# Patient Record
Sex: Female | Born: 1960 | Race: Black or African American | Hispanic: No | Marital: Married | Smoking: Never smoker
Health system: Southern US, Community
[De-identification: ages and names within clinical notes are randomized; demographics above are authoritative.]

## PROBLEM LIST (undated history)

## (undated) DIAGNOSIS — IMO0002 Reserved for concepts with insufficient information to code with codable children: Secondary | ICD-10-CM

## (undated) DIAGNOSIS — T8859XA Other complications of anesthesia, initial encounter: Secondary | ICD-10-CM

## (undated) DIAGNOSIS — K219 Gastro-esophageal reflux disease without esophagitis: Secondary | ICD-10-CM

## (undated) DIAGNOSIS — R011 Cardiac murmur, unspecified: Secondary | ICD-10-CM

## (undated) DIAGNOSIS — D573 Sickle-cell trait: Secondary | ICD-10-CM

## (undated) DIAGNOSIS — T4145XA Adverse effect of unspecified anesthetic, initial encounter: Secondary | ICD-10-CM

## (undated) DIAGNOSIS — T7840XA Allergy, unspecified, initial encounter: Secondary | ICD-10-CM

## (undated) HISTORY — DX: Cardiac murmur, unspecified: R01.1

## (undated) HISTORY — DX: Gastro-esophageal reflux disease without esophagitis: K21.9

## (undated) HISTORY — PX: HEMORRHOID SURGERY: SHX153

## (undated) HISTORY — PX: DILATION AND CURETTAGE OF UTERUS: SHX78

## (undated) HISTORY — DX: Allergy, unspecified, initial encounter: T78.40XA

## (undated) HISTORY — PX: ABDOMINAL HYSTERECTOMY: SHX81

## (undated) HISTORY — DX: Sickle-cell trait: D57.3

## (undated) HISTORY — DX: Reserved for concepts with insufficient information to code with codable children: IMO0002

---

## 1998-04-16 ENCOUNTER — Other Ambulatory Visit: Admission: RE | Admit: 1998-04-16 | Discharge: 1998-04-16 | Payer: Self-pay | Admitting: Obstetrics and Gynecology

## 1999-03-30 ENCOUNTER — Encounter: Payer: Self-pay | Admitting: Emergency Medicine

## 1999-03-30 ENCOUNTER — Emergency Department (HOSPITAL_COMMUNITY): Admission: EM | Admit: 1999-03-30 | Discharge: 1999-03-30 | Payer: Self-pay | Admitting: Emergency Medicine

## 1999-05-23 ENCOUNTER — Other Ambulatory Visit: Admission: RE | Admit: 1999-05-23 | Discharge: 1999-05-23 | Payer: Self-pay | Admitting: Obstetrics and Gynecology

## 2000-07-06 ENCOUNTER — Other Ambulatory Visit: Admission: RE | Admit: 2000-07-06 | Discharge: 2000-07-06 | Payer: Self-pay | Admitting: Obstetrics and Gynecology

## 2000-08-18 ENCOUNTER — Ambulatory Visit (HOSPITAL_COMMUNITY): Admission: RE | Admit: 2000-08-18 | Discharge: 2000-08-18 | Payer: Self-pay | Admitting: Obstetrics and Gynecology

## 2001-08-09 ENCOUNTER — Other Ambulatory Visit: Admission: RE | Admit: 2001-08-09 | Discharge: 2001-08-09 | Payer: Self-pay | Admitting: Obstetrics and Gynecology

## 2003-01-03 ENCOUNTER — Other Ambulatory Visit: Admission: RE | Admit: 2003-01-03 | Discharge: 2003-01-03 | Payer: Self-pay | Admitting: Obstetrics and Gynecology

## 2003-09-04 ENCOUNTER — Encounter: Admission: RE | Admit: 2003-09-04 | Discharge: 2003-09-04 | Payer: Self-pay | Admitting: Obstetrics and Gynecology

## 2004-01-04 ENCOUNTER — Other Ambulatory Visit: Admission: RE | Admit: 2004-01-04 | Discharge: 2004-01-04 | Payer: Self-pay | Admitting: Obstetrics and Gynecology

## 2004-05-01 ENCOUNTER — Ambulatory Visit: Payer: Self-pay | Admitting: Family Medicine

## 2004-07-25 ENCOUNTER — Encounter: Admission: RE | Admit: 2004-07-25 | Discharge: 2004-07-25 | Payer: Self-pay | Admitting: Obstetrics and Gynecology

## 2005-03-03 ENCOUNTER — Other Ambulatory Visit: Admission: RE | Admit: 2005-03-03 | Discharge: 2005-03-03 | Payer: Self-pay | Admitting: Obstetrics and Gynecology

## 2005-08-18 ENCOUNTER — Encounter: Admission: RE | Admit: 2005-08-18 | Discharge: 2005-08-18 | Payer: Self-pay | Admitting: Obstetrics and Gynecology

## 2005-08-31 ENCOUNTER — Encounter: Admission: RE | Admit: 2005-08-31 | Discharge: 2005-08-31 | Payer: Self-pay | Admitting: Obstetrics and Gynecology

## 2006-04-07 ENCOUNTER — Ambulatory Visit: Payer: Self-pay | Admitting: Family Medicine

## 2006-04-07 LAB — CONVERTED CEMR LAB
Free T4: 0.6 ng/dL (ref 0.6–1.6)
T3, Free: 2.9 pg/mL (ref 2.3–4.2)
TSH: 1.3 microintl units/mL (ref 0.35–5.50)

## 2006-04-14 ENCOUNTER — Ambulatory Visit: Payer: Self-pay

## 2006-06-08 ENCOUNTER — Other Ambulatory Visit: Admission: RE | Admit: 2006-06-08 | Discharge: 2006-06-08 | Payer: Self-pay | Admitting: Obstetrics and Gynecology

## 2006-06-28 DIAGNOSIS — N6019 Diffuse cystic mastopathy of unspecified breast: Secondary | ICD-10-CM | POA: Insufficient documentation

## 2006-07-02 ENCOUNTER — Ambulatory Visit: Payer: Self-pay | Admitting: Family Medicine

## 2006-07-06 LAB — CONVERTED CEMR LAB
ALT: 17 units/L (ref 0–40)
AST: 20 units/L (ref 0–37)
Alkaline Phosphatase: 64 units/L (ref 39–117)
BUN: 6 mg/dL (ref 6–23)
Basophils Absolute: 0 10*3/uL (ref 0.0–0.1)
Bilirubin, Direct: 0.1 mg/dL (ref 0.0–0.3)
Calcium: 10.8 mg/dL — ABNORMAL HIGH (ref 8.4–10.5)
Chloride: 105 meq/L (ref 96–112)
GFR calc non Af Amer: 63 mL/min
HCT: 37.6 % (ref 36.0–46.0)
Hemoglobin: 13 g/dL (ref 12.0–15.0)
Lymphocytes Relative: 35.7 % (ref 12.0–46.0)
MCV: 89.9 fL (ref 78.0–100.0)
Monocytes Absolute: 0.3 10*3/uL (ref 0.2–0.7)
Monocytes Relative: 5.8 % (ref 3.0–11.0)
RBC: 4.18 M/uL (ref 3.87–5.11)
RDW: 12.3 % (ref 11.5–14.6)
Sodium: 140 meq/L (ref 135–145)
Total Protein: 7.1 g/dL (ref 6.0–8.3)
VLDL: 35 mg/dL (ref 0–40)
WBC: 5 10*3/uL (ref 4.5–10.5)

## 2006-07-12 ENCOUNTER — Ambulatory Visit: Payer: Self-pay | Admitting: Family Medicine

## 2006-07-14 ENCOUNTER — Telehealth (INDEPENDENT_AMBULATORY_CARE_PROVIDER_SITE_OTHER): Payer: Self-pay | Admitting: *Deleted

## 2006-07-14 DIAGNOSIS — E213 Hyperparathyroidism, unspecified: Secondary | ICD-10-CM | POA: Insufficient documentation

## 2006-07-14 LAB — CONVERTED CEMR LAB
Calcium, Total (PTH): 10.3 mg/dL (ref 8.4–10.5)
PTH: 80.5 pg/mL — ABNORMAL HIGH (ref 14.0–72.0)

## 2006-08-27 ENCOUNTER — Encounter: Payer: Self-pay | Admitting: Family Medicine

## 2006-09-03 ENCOUNTER — Encounter: Admission: RE | Admit: 2006-09-03 | Discharge: 2006-09-03 | Payer: Self-pay | Admitting: Surgery

## 2006-09-17 ENCOUNTER — Encounter: Payer: Self-pay | Admitting: Family Medicine

## 2006-09-19 ENCOUNTER — Encounter: Admission: RE | Admit: 2006-09-19 | Discharge: 2006-09-19 | Payer: Self-pay | Admitting: Surgery

## 2006-09-29 ENCOUNTER — Ambulatory Visit: Payer: Self-pay | Admitting: Internal Medicine

## 2006-09-29 LAB — CONVERTED CEMR LAB
Beta hcg, urine, semiquantitative: NEGATIVE
Ketones, urine, test strip: NEGATIVE
Nitrite: NEGATIVE
Specific Gravity, Urine: 1.01
pH: 6

## 2006-09-30 ENCOUNTER — Encounter: Payer: Self-pay | Admitting: Internal Medicine

## 2006-10-12 ENCOUNTER — Telehealth: Payer: Self-pay | Admitting: Internal Medicine

## 2006-12-31 ENCOUNTER — Encounter: Admission: RE | Admit: 2006-12-31 | Discharge: 2006-12-31 | Payer: Self-pay | Admitting: Obstetrics and Gynecology

## 2007-08-29 ENCOUNTER — Other Ambulatory Visit: Admission: RE | Admit: 2007-08-29 | Discharge: 2007-08-29 | Payer: Self-pay | Admitting: Gynecology

## 2008-01-17 ENCOUNTER — Encounter: Admission: RE | Admit: 2008-01-17 | Discharge: 2008-01-17 | Payer: Self-pay | Admitting: Gynecology

## 2008-09-04 ENCOUNTER — Ambulatory Visit: Payer: Self-pay | Admitting: Family Medicine

## 2008-09-04 DIAGNOSIS — K3189 Other diseases of stomach and duodenum: Secondary | ICD-10-CM | POA: Insufficient documentation

## 2008-09-04 DIAGNOSIS — R1013 Epigastric pain: Secondary | ICD-10-CM

## 2008-09-05 ENCOUNTER — Encounter: Payer: Self-pay | Admitting: Family Medicine

## 2008-09-06 LAB — CONVERTED CEMR LAB
ALT: 16 units/L (ref 0–35)
Basophils Absolute: 0 10*3/uL (ref 0.0–0.1)
Basophils Relative: 0.8 % (ref 0.0–3.0)
CO2: 26 meq/L (ref 19–32)
Calcium: 10.4 mg/dL (ref 8.4–10.5)
Chloride: 108 meq/L (ref 96–112)
Creatinine, Ser: 0.9 mg/dL (ref 0.4–1.2)
Eosinophils Relative: 1.2 % (ref 0.0–5.0)
Glucose, Bld: 71 mg/dL (ref 70–99)
Lymphs Abs: 2.4 10*3/uL (ref 0.7–4.0)
Monocytes Absolute: 1.4 10*3/uL — ABNORMAL HIGH (ref 0.1–1.0)
Monocytes Relative: 28.1 % — ABNORMAL HIGH (ref 3.0–12.0)
Neutro Abs: 1 10*3/uL — ABNORMAL LOW (ref 1.4–7.7)
Neutrophils Relative %: 20.2 % — ABNORMAL LOW (ref 43.0–77.0)
Platelets: 300 10*3/uL (ref 150.0–400.0)
RBC: 4.1 M/uL (ref 3.87–5.11)
Sodium: 138 meq/L (ref 135–145)
TSH: 1.37 microintl units/mL (ref 0.35–5.50)
Total CHOL/HDL Ratio: 5
Triglycerides: 142 mg/dL (ref 0.0–149.0)

## 2008-09-13 ENCOUNTER — Ambulatory Visit: Payer: Self-pay | Admitting: Women's Health

## 2008-09-13 ENCOUNTER — Telehealth: Payer: Self-pay | Admitting: Family Medicine

## 2008-09-13 ENCOUNTER — Other Ambulatory Visit: Admission: RE | Admit: 2008-09-13 | Discharge: 2008-09-13 | Payer: Self-pay | Admitting: Gynecology

## 2008-09-13 ENCOUNTER — Encounter (INDEPENDENT_AMBULATORY_CARE_PROVIDER_SITE_OTHER): Payer: Self-pay | Admitting: *Deleted

## 2008-09-13 ENCOUNTER — Ambulatory Visit: Payer: Self-pay | Admitting: Gastroenterology

## 2008-09-13 ENCOUNTER — Encounter: Payer: Self-pay | Admitting: Women's Health

## 2008-09-13 DIAGNOSIS — K219 Gastro-esophageal reflux disease without esophagitis: Secondary | ICD-10-CM | POA: Insufficient documentation

## 2008-09-26 DIAGNOSIS — IMO0002 Reserved for concepts with insufficient information to code with codable children: Secondary | ICD-10-CM

## 2008-09-26 HISTORY — DX: Reserved for concepts with insufficient information to code with codable children: IMO0002

## 2008-09-27 ENCOUNTER — Encounter: Payer: Self-pay | Admitting: Gynecology

## 2008-09-27 ENCOUNTER — Ambulatory Visit: Payer: Self-pay | Admitting: Gynecology

## 2008-10-23 ENCOUNTER — Telehealth: Payer: Self-pay | Admitting: Gastroenterology

## 2008-10-26 ENCOUNTER — Ambulatory Visit: Payer: Self-pay | Admitting: Gastroenterology

## 2009-03-12 ENCOUNTER — Telehealth: Payer: Self-pay | Admitting: Family Medicine

## 2009-03-22 ENCOUNTER — Encounter: Admission: RE | Admit: 2009-03-22 | Discharge: 2009-03-22 | Payer: Self-pay | Admitting: Gynecology

## 2009-08-20 ENCOUNTER — Emergency Department (HOSPITAL_COMMUNITY): Admission: EM | Admit: 2009-08-20 | Discharge: 2009-08-20 | Payer: Self-pay | Admitting: Emergency Medicine

## 2009-09-20 ENCOUNTER — Other Ambulatory Visit: Admission: RE | Admit: 2009-09-20 | Discharge: 2009-09-20 | Payer: Self-pay | Admitting: Gynecology

## 2009-09-20 ENCOUNTER — Ambulatory Visit: Payer: Self-pay | Admitting: Women's Health

## 2009-09-24 LAB — CONVERTED CEMR LAB: Pap Smear: NORMAL

## 2009-10-15 ENCOUNTER — Ambulatory Visit: Payer: Self-pay | Admitting: Family Medicine

## 2009-10-15 ENCOUNTER — Encounter: Admission: RE | Admit: 2009-10-15 | Discharge: 2009-10-15 | Payer: Self-pay | Admitting: Family Medicine

## 2009-10-15 DIAGNOSIS — R079 Chest pain, unspecified: Secondary | ICD-10-CM | POA: Insufficient documentation

## 2009-10-15 DIAGNOSIS — R0609 Other forms of dyspnea: Secondary | ICD-10-CM | POA: Insufficient documentation

## 2009-10-15 DIAGNOSIS — R0989 Other specified symptoms and signs involving the circulatory and respiratory systems: Secondary | ICD-10-CM

## 2009-10-15 DIAGNOSIS — E559 Vitamin D deficiency, unspecified: Secondary | ICD-10-CM | POA: Insufficient documentation

## 2009-10-15 DIAGNOSIS — K449 Diaphragmatic hernia without obstruction or gangrene: Secondary | ICD-10-CM | POA: Insufficient documentation

## 2009-10-15 LAB — CONVERTED CEMR LAB
Glucose, Urine, Semiquant: NEGATIVE
Ketones, urine, test strip: NEGATIVE
Specific Gravity, Urine: <1.005
Troponin I: <0.01 ng/mL (ref <0.06)
Urobilinogen, UA: NEGATIVE
pH: 5

## 2009-10-16 ENCOUNTER — Telehealth: Payer: Self-pay | Admitting: Family Medicine

## 2009-10-16 ENCOUNTER — Encounter: Payer: Self-pay | Admitting: Family Medicine

## 2009-10-16 LAB — CONVERTED CEMR LAB
AST: 20 units/L (ref 0–37)
Albumin: 3.8 g/dL (ref 3.5–5.2)
Alkaline Phosphatase: 60 units/L (ref 39–117)
BUN: 10 mg/dL (ref 6–23)
Basophils Absolute: 0 10*3/uL (ref 0.0–0.1)
Basophils Relative: 0.2 % (ref 0.0–3.0)
CK-MB: 0.5 ng/mL (ref 0.3–4.0)
Calcium: 10.7 mg/dL — ABNORMAL HIGH (ref 8.4–10.5)
Chloride: 107 meq/L (ref 96–112)
Cholesterol: 246 mg/dL — ABNORMAL HIGH (ref 0–200)
Direct LDL: 175.5 mg/dL
Eosinophils Absolute: 0.1 10*3/uL (ref 0.0–0.7)
GFR calc non Af Amer: 84.65 mL/min (ref >60)
Hemoglobin: 13.4 g/dL (ref 12.0–15.0)
Lymphocytes Relative: 50.7 % — ABNORMAL HIGH (ref 12.0–46.0)
MCHC: 34.3 g/dL (ref 30.0–36.0)
MCV: 92.2 fL (ref 78.0–100.0)
Monocytes Absolute: 0.4 10*3/uL (ref 0.1–1.0)
RDW: 13.3 % (ref 11.5–14.6)
Sodium: 140 meq/L (ref 135–145)
Total Bilirubin: 0.6 mg/dL (ref 0.3–1.2)
Total CK: 72 units/L (ref 7–177)
WBC: 5.1 10*3/uL (ref 4.5–10.5)

## 2009-10-18 ENCOUNTER — Telehealth (INDEPENDENT_AMBULATORY_CARE_PROVIDER_SITE_OTHER): Payer: Self-pay | Admitting: *Deleted

## 2009-10-21 ENCOUNTER — Encounter: Payer: Self-pay | Admitting: Family Medicine

## 2009-11-05 ENCOUNTER — Telehealth (INDEPENDENT_AMBULATORY_CARE_PROVIDER_SITE_OTHER): Payer: Self-pay | Admitting: Radiology

## 2009-11-06 ENCOUNTER — Encounter: Payer: Self-pay | Admitting: Cardiovascular Disease

## 2009-11-06 ENCOUNTER — Ambulatory Visit: Payer: Self-pay

## 2009-11-06 ENCOUNTER — Ambulatory Visit (HOSPITAL_COMMUNITY): Admission: RE | Admit: 2009-11-06 | Discharge: 2009-11-06 | Payer: Self-pay | Admitting: Family Medicine

## 2009-11-06 ENCOUNTER — Encounter: Payer: Self-pay | Admitting: Family Medicine

## 2009-11-06 ENCOUNTER — Encounter (HOSPITAL_COMMUNITY)
Admission: RE | Admit: 2009-11-06 | Discharge: 2009-11-22 | Payer: Self-pay | Source: Home / Self Care | Admitting: Family Medicine

## 2009-11-06 ENCOUNTER — Ambulatory Visit: Payer: Self-pay | Admitting: Cardiovascular Disease

## 2009-11-07 ENCOUNTER — Encounter (HOSPITAL_COMMUNITY)
Admission: RE | Admit: 2009-11-07 | Discharge: 2009-11-22 | Payer: Self-pay | Source: Home / Self Care | Admitting: Surgery

## 2009-12-10 ENCOUNTER — Telehealth (INDEPENDENT_AMBULATORY_CARE_PROVIDER_SITE_OTHER): Payer: Self-pay | Admitting: *Deleted

## 2009-12-26 HISTORY — PX: PARATHYROID EXPLORATION: SHX732

## 2009-12-31 ENCOUNTER — Ambulatory Visit (HOSPITAL_COMMUNITY)
Admission: RE | Admit: 2009-12-31 | Discharge: 2009-12-31 | Payer: Self-pay | Source: Home / Self Care | Admitting: Surgery

## 2010-01-01 ENCOUNTER — Telehealth: Payer: Self-pay | Admitting: Family Medicine

## 2010-01-02 ENCOUNTER — Ambulatory Visit: Payer: Self-pay | Admitting: Family Medicine

## 2010-01-28 ENCOUNTER — Encounter: Payer: Self-pay | Admitting: Family Medicine

## 2010-02-16 ENCOUNTER — Encounter: Payer: Self-pay | Admitting: Obstetrics and Gynecology

## 2010-02-27 NOTE — Consult Note (Signed)
Summary: [facility]   Imported By: Lanelle Bal 11/07/2009 13:30:28  _____________________________________________________________________  External Attachment:    Type:   Image     Comment:   External Document

## 2010-02-27 NOTE — Letter (Signed)
Summary: [facility]   Imported By: Lanelle Bal 02/13/2010 14:22:38  _____________________________________________________________________  External Attachment:    Type:   Image     Comment:   External Document

## 2010-02-27 NOTE — Miscellaneous (Signed)
Summary: Appointment Canceled  Appointment status changed to canceled by LinkLog[employer] on 10/18/2009 9:36 AM.  Cancellation Comments --------------------- crs/dx:DOE/wt:193/ins:cigna/dr lowne  Appointment Information ----------------------- Appt Type:  CARDIOLOGY NUCLEAR TESTING      Date:  Monday, October 28, 2009      Time:  9:30 AM for 15 min   Urgency:  Routine   Made By:  Pearson Grippe  To Visit:  [id number]    Reason:  crs/dx:DOE/wt:193/ins:cigna/dr lowne  Appt Comments ------------- -- 10/18/09 9:36: (CEM[facility]) CANCELED -- crs/dx:DOE/wt:193/ins:cigna/dr lowne -- 10/17/09 12:16: (CEMR) BOOKED -- Routine CARDIOLOGY NUCLEAR TESTING at 10/28/2009 9:30 AM for 15 min crs/dx:DOE/wt:193/ins:cigna/dr lowne

## 2010-02-27 NOTE — Progress Notes (Signed)
Summary: Records Request  Faxed Stress, Echo & EKG to Darlene at [facility] ([phone]). Debby Freiberg  December 10, 2009 12:45 PM

## 2010-02-27 NOTE — Progress Notes (Signed)
Summary: NUC PRE-PROCEDURE  Phone Note Outgoing Call   Call placed by: Domenic Polite, CNMT,  November 05, 2009 9:33 AM Call placed to: Patient Reason for Call: Confirm/change Appt Summary of Call: Left message with information on Myoview Information Sheet (see scanned document for details).      Nuclear Med Background Indications for Stress Test: Evaluation for Ischemia     Symptoms: Chest Pain with Exertion, DOE    Nuclear Pre-Procedure Cardiac Risk Factors: Family History - CAD Height (in): 67

## 2010-02-27 NOTE — Progress Notes (Signed)
Summary: Meds   Phone Note Call from Patient   Summary of Call: Pt is currently taking Aciphex but it is to expensive for her, she is wondering if there is something else that you can call in for her acid reflux. Please advise. Army Fossa CMA  March 12, 2009 10:25 AM   Follow-up for Phone Call        omeprazole 20 mg  1 by mouth once daily  #30  5 refills  Follow-up by: Loreen Freud DO,  March 12, 2009 11:53 AM  Additional Follow-up for Phone Call Additional follow up Details #1::        Pt is aware. Army Fossa CMA  March 12, 2009 12:03 PM     New/Updated Medications: OMEPRAZOLE 20 MG CPDR (OMEPRAZOLE) 1 by mouth once daily. Prescriptions: OMEPRAZOLE 20 MG CPDR (OMEPRAZOLE) 1 by mouth once daily.  #30 x 5   Entered by:   Army Fossa CMA   Authorized by:   Loreen Freud DO   Signed by:   Army Fossa CMA on 03/12/2009   Method used:   Elect[facility]ically to        [facility] [phone]* (retail)       [address]       [place], [place]  [address]       Ph: [phone]       Fax: [phone]   RxID:   [id number]

## 2010-02-27 NOTE — Assessment & Plan Note (Signed)
Summary: Cardiology Nuclear Testing  Nuclear Med Background Indications for Stress Test: Evaluation for Ischemia     Symptoms: Chest Pain with Exertion, DOE, Palpitations    Nuclear Pre-Procedure Cardiac Risk Factors: Family History - CAD Caffeine/Decaff Intake: None NPO After: 7:00 PM Lungs: clear IV 0.9% NS with Angio Cath: 22g     IV Site: R Antecubital IV Started by: Irean Hong, RN Chest Size (in) 38     Cup Size C     Height (in): 67 Weight (lb): 190 BMI: 29.87  Nuclear Med Study 1 or 2 day study:  1 day     Stress Test Type:  Stress Reading MD:  Charlton Haws, MD     Referring MD:  Loann Quill Resting Radionuclide:  Technetium 72m Tetrofosmin     Resting Radionuclide Dose:  11 mCi  Stress Radionuclide:  Technetium 28m Tetrofosmin     Stress Radionuclide Dose:  33 mCi   Stress Protocol Exercise Time (min):  7:15 min     Max HR:  155 bpm     Predicted Max HR:  169 bpm  Max Systolic BP: 181 mm Hg     Percent Max HR:  91.72 %     METS: 8.9 Rate Pressure Product:  74259    Stress Test Technologist:  Milana Na, EMT-P     Nuclear Technologist:  Doyne Keel, CNMT  Rest Procedure  Myocardial perfusion imaging was performed at rest 45 minutes following the intravenous administration of Technetium 11m Tetrofosmin.  Stress Procedure  The patient exercised for 7:15. The patient stopped due to fatigue and denied any chest pain.  There were no significant ST-T wave changes and a rare pvc.  Technetium 27m Tetrofosmin was injected at peak exercise and myocardial perfusion imaging was performed after a brief delay.  QPS Raw Data Images:  Normal; no motion artifact; normal heart/lung ratio. Stress Images:  Normal homogeneous uptake in all areas of the myocardium. Rest Images:  Normal homogeneous uptake in all areas of the myocardium. Subtraction (SDS):  Normal Transient Ischemic Dilatation:  0.97  (Normal <1.22)  Lung/Heart Ratio:  0.27  (Normal <0.45)  Quantitative Gated  Spect Images QGS EDV:  75 ml QGS ESV:  27 ml QGS EF:  64 % QGS cine images:  normal  Findings Normal nuclear study      Overall Impression  Exercise Capacity: Fair exercise capacity. BP Response: Normal blood pressure response. Clinical Symptoms: No chest pain ECG Impression: No significant ST segment change suggestive of ischemia. Overall Impression: Normal stress nuclear study.

## 2010-02-27 NOTE — Progress Notes (Signed)
Summary: Trouble Voiding  Phone Note Call from Patient Call back at [facility] Phone [phone]   Caller: Patient Summary of Call: Patient called and LM on triage VM stating that she had surgery on her parathyroid yesterday and is now having trouble with passing urine. She called and spoke with a nurse at the surgeon's (Dr. Gerrit Friends) office and they informed her she should call her PCP.  Spoke with patient and she has been able to urinate today, but last night she tried 3 times and she was unable to void until bladder was extremly full and the flow was slow to empty. The same thing happened this morning. Patient has no other symptoms. Please advise.  Initial call taken by: Harold Barban,  January 01, 2010 12:45 PM  Follow-up for Phone Call        she needs ov---if she can not void she will need to see urology urgently----are the patients being told I'm out of office? Follow-up by: Loreen Freud DO,  January 01, 2010 1:22 PM  Additional Follow-up for Phone Call Additional follow up Details #1::        Spk with pt and she stated she had been able to void today and just can't go until bladder is completely full. She stated she has had these same symptoms last time she had surgery and it cleared up in 2 to 3 days. Advised pt she will need and evaluation before we can tell her what it is, stated she will make the appt but if she feels better tomorrow she will call and cancel. appt scheduled for 3:45 tomorrow with Dr.Lowne Additional Follow-up by: Almeta Monas CMA Duncan Dull),  January 01, 2010 1:38 PM

## 2010-02-27 NOTE — Progress Notes (Signed)
Summary: Results  Phone Note Outgoing Call   Details for Reason: lower vita D 3  to 1000mg  daily if pt never saw endocrine for PTH ---refer to endo Summary of Call: Left message to call back Almeta Monas CMA Duncan Dull)  October 18, 2009 11:27 AM  Pt aware.    Almeta Monas CMA Duncan Dull)  October 18, 2009 12:04 PM

## 2010-02-27 NOTE — Miscellaneous (Signed)
Summary: Appointment Canceled  Appointment status changed to canceled by [employer] on 10/18/2009 9:36 AM.  Cancellation Comments --------------------- ECHO/DOE/jml  Appointment Information ----------------------- Appt Type:  CARDIOLOGY ANCILLARY VISIT      Date:  Monday, October 28, 2009      Time:  8:30 AM for 60 min   Urgency:  Routine   Made By:  Pearson Grippe  To Visit:  LBCARDECCECHOII-[id number]-MDS    Reason:  ECHO/DOE/jml  Appt Comments ------------- -- 10/18/09 9:36: ([facility]) CANCELED -- ECHO/DOE/jml -- 10/17/09 12:16: ([facility]) BOOKED -- Routine CARDIOLOGY ANCILLARY VISIT at 10/28/2009 8:30 AM for 60 min ECHO/DOE/jml

## 2010-02-27 NOTE — Progress Notes (Signed)
Summary: Lab/CT Results (lmom 9/21)  Phone Note Outgoing Call   Summary of Call: Ideally your LDL (bad cholesterol) should be <_100___, your HDL (good cholesterol) should be >__40_ and your triglycerides should be< 150.  Diet and exercise will increase HDL and decrease the LDL and triglycerides. Read Dr. Danice Goltz book--Eat Drink and Be Healthy.  We will recheck labs in _3__ months.  if still elevated we will need to start medication.    boston heart, lipid, hep 272.4  Calcium is still elevated----add PTH  ~ already done Did pt ever f/u with Dr Gerrit Friends?  NO PE!!   + atelectasis in lungs---- small pockets of fluid in lungs----get some balloons and blow them up 10x a day---this will fully expand lungs and get rid of fluid  + hiatal hernia---  this can cause chest pain----if you were not out of omeprazole for a while we may need to change it to something stronger like Dexilant 60 mg 1 by mouth once daily #30 5 refills    Signed by Loreen Freud DO on 10/15/2009 at 9:59 PM  Follow-up for Phone Call        lmtcb.Marland KitchenMarland KitchenHarold Barban  October 16, 2009 2:56 PM  spoke w/ patient aware of labs and recommendations says that she hasn't see Dr. Gerrit Friends in about a year .......Marland KitchenDoristine Devoid CMA  October 16, 2009 3:15 PM   Additional Follow-up for Phone Call Additional follow up Details #1::        pt need f/u---- we don't have last ov note---find out from [facility] if she was supposed to have f/u----if not---she needs Endo referral Additional Follow-up by: Loreen Freud DO,  October 17, 2009 9:22 AM    Additional Follow-up for Phone Call Additional follow up Details #2::    SPK with [facility] and they advised the pt has a F/U on Sept 22, 201.  Almeta Monas CMA Duncan Dull)  October 21, 2009 2:55 PM

## 2010-02-27 NOTE — Assessment & Plan Note (Signed)
Summary: cpx/cbs   Vital Signs:  Patient profile:   50 year old female Height:      67 inches Weight:      193.2 pounds BMI:     30.37 O2 Sat:      99 % on Room air Temp:     98.0 degrees F oral Pulse rate:   76 / minute Pulse rhythm:   regular BP sitting:   118 / 80  (right arm) Cuff size:   regular  Vitals Entered By: Almeta Monas CMA Duncan Dull) (October 15, 2009 1:08 PM)  O2 Flow:  Room air CC: cpx/not fasting, no pap needed   History of Present Illness: Gwendolyn Weaver here for cpe and labs----last ate early am.   Gwendolyn Weaver has cp after walking up steps at [employer] this weekend.  It has occurred several times with exertion.    Preventive Screening-Counseling & Management  Alcohol-Tobacco     Alcohol drinks/day: 0     Smoking Status: never  Caffeine-Diet-Exercise     Caffeine use/day: 0     Does Patient Exercise: no  Hep-HIV-STD-Contraception     Dental Visit-last 6 months yes     Dental Care Counseling: not indicated; dental care within six months     SBE monthly: yes     SBE Education/Counseling: to perform regular SBE  Safety-Violence-Falls     Seat Belt Use: yes      Sexual History:  currently monogamous.    Current Medications (verified): 1)  Aciphex 20 Mg Tbec (Rabeprazole Sodium) .Marland Kitchen.. 1 By Mouth Once Daily 2)  Multivitamins  Tabs (Multiple Vitamin) .... Once Daily 3)  Vitamin D3 5000 Unit/ml Liqd (Cholecalciferol) .... Take Once Daily 4)  Loestrin 1/20 (21) 1-20 Mg-Mcg Tabs (Norethindrone Acet-Ethinyl Est) .... Take As Directed 5)  Omeprazole 20 Mg Cpdr (Omeprazole) .Marland Kitchen.. 1 By Mouth Once Daily.  Allergies (verified): No Known Drug Allergies  Past History:  Past Medical History: Last updated: 09/13/2008  Urinary Tract Infection  Past Surgical History: Last updated: 06/28/2006 Caesarean section Hemorrhoidectomy Tubal ligation  Family History: Last updated: 10/15/2009 Family History Hypertension Family History Other cancer-Stomach Family History  Diabetes Family History of CAD Female 1st degree relative 69-70yo  Social History: Last updated: 07/02/2006 Married Never Smoked Alcohol use-no Drug use-no Occupation: Merchandiser, retail in call center Regular exercise-no  Risk Factors: Alcohol Use: 0 (10/15/2009) Caffeine Use: 0 (10/15/2009) Exercise: no (10/15/2009)  Risk Factors: Smoking Status: never (10/15/2009)  Family History: Reviewed history from 06/28/2006 and no changes required. Family History Hypertension Family History Other cancer-Stomach Family History Diabetes Family History of CAD Female 1st degree relative 84-70yo  Social History: Reviewed history from 07/02/2006 and no changes required. Married Never Smoked Alcohol use-no Drug use-no Occupation: Merchandiser, retail in call center Regular exercise-no Sexual History:  currently monogamous  Review of Systems      See HPI General:  Denies chills, fatigue, fever, loss of appetite, malaise, sleep disorder, sweats, weakness, and weight loss. Eyes:  Denies blurring, discharge, double vision, eye irritation, eye pain, halos, itching, light sensitivity, red eye, vision loss-1 eye, and vision loss-both eyes; optho q1y. ENT:  Denies decreased hearing, difficulty swallowing, ear discharge, earache, hoarseness, nasal congestion, nosebleeds, postnasal drainage, ringing in ears, sinus pressure, and sore throat. CV:  Complains of chest pain or discomfort and shortness of breath with exertion; denies bluish discoloration of lips or nails, difficulty breathing at night, difficulty breathing while lying down, fainting, fatigue, leg cramps with exertion, lightheadness, near fainting, palpitations, swelling of feet,  swelling of hands, and weight gain. Resp:  Complains of shortness of breath; denies chest discomfort, chest pain with inspiration, cough, coughing up blood, excessive snoring, hypersomnolence, morning headaches, pleuritic, sputum productive, and wheezing. GI:  Denies abdominal pain,  bloody stools, change in bowel habits, constipation, dark tarry stools, diarrhea, excessive appetite, gas, hemorrhoids, indigestion, loss of appetite, nausea, vomiting, vomiting blood, and yellowish skin color. GU:  Denies abnormal vaginal bleeding, decreased libido, discharge, dysuria, genital sores, hematuria, incontinence, nocturia, urinary frequency, and urinary hesitancy. MS:  Denies joint pain, joint redness, joint swelling, loss of strength, low back pain, mid back pain, muscle aches, muscle , cramps, muscle weakness, stiffness, and thoracic pain. Derm:  Denies changes in color of skin, changes in nail beds, dryness, excessive perspiration, flushing, hair loss, insect bite(s), itching, lesion(s), poor wound healing, and rash. Neuro:  Denies brief paralysis, difficulty with concentration, disturbances in coordination, falling down, headaches, inability to speak, memory loss, numbness, poor balance, seizures, sensation of room spinning, tingling, tremors, visual disturbances, and weakness. Psych:  Denies alternate hallucination ( auditory/visual), anxiety, depression, easily angered, easily tearful, irritability, mental problems, panic attacks, sense of great danger, suicidal thoughts/plans, thoughts of violence, unusual visions or sounds, and thoughts /plans of harming others. Endo:  Denies cold intolerance, excessive hunger, excessive thirst, excessive urination, heat intolerance, polyuria, and weight change. Heme:  Denies abnormal bruising, bleeding, enlarge lymph nodes, fevers, pallor, and skin discoloration. Allergy:  Denies hives or rash, itching eyes, persistent infections, seasonal allergies, and sneezing.  Physical Exam  General:  Well-developed,well-nourished,in no acute distress; alert,appropriate and cooperative throughout examination Head:  Normocephalic and atraumatic without obvious abnormalities. No apparent alopecia or balding. Eyes:  pupils equal, pupils round, pupils reactive to  light, and no injection.   Ears:  External ear exam shows no significant lesions or deformities.  Otoscopic examination reveals clear canals, tympanic membranes are intact bilaterally without bulging, retraction, inflammation or discharge. Hearing is grossly normal bilaterally. Nose:  External nasal examination shows no deformity or inflammation. Nasal mucosa are pink and moist without lesions or exudates. Mouth:  Oral mucosa and oropharynx without lesions or exudates.  Teeth in good repair. Neck:  No deformities, masses, or tenderness noted. Breasts:  gyn Heart:  normal rate and no murmur.   Abdomen:  Bowel sounds positive,abdomen soft and non-tender without masses, organomegaly or hernias noted. Rectal:  gyn Genitalia:  gyn Msk:  normal ROM, no joint tenderness, no joint swelling, no joint warmth, no redness over joints, no joint deformities, no joint instability, and no crepitation.   Pulses:  R posterior tibial normal, R dorsalis pedis normal, R carotid normal, L posterior tibial normal, L dorsalis pedis normal, and L carotid normal.   Extremities:  No clubbing, cyanosis, edema, or deformity noted with normal full range of motion of all joints.   Neurologic:  No cranial nerve deficits noted. Station and gait are normal. Plantar reflexes are down-going bilaterally. DTRs are symmetrical throughout. Sensory, motor and coordinative functions appear intact. Skin:  Intact without suspicious lesions or rashes Cervical Nodes:  No lymphadenopathy noted Axillary Nodes:  No palpable lymphadenopathy Psych:  Cognition and judgment appear intact. Alert and cooperative with normal attention span and concentration. No apparent delusions, illusions, hallucinations   Impression & Recommendations:  Problem # 1:  PREVENTIVE HEALTH CARE (ICD-V70.0) ghm utd  Orders: Venipuncture (16109) TLB-Lipid Panel (80061-LIPID) TLB-BMP (Basic Metabolic Panel-BMET) (80048-METABOL) TLB-CBC Platelet - w/Differential  (85025-CBCD) TLB-Hepatic/Liver Function Pnl (80076-HEPATIC) TLB-TSH (Thyroid Stimulating Hormone) (84443-TSH) T- * Misc.  Laboratory test [phone]) Specimen Handling (32951) EKG w/ Interpretation (93000)  Problem # 2:  DYSPNEA ON EXERTION (ICD-786.09)  Orders: TLB-Cardiac Panel (88416_60630-ZSWF) T-D-Dimer Fibrin Derivatives Quantitive [phone]) T- * Misc. Laboratory test [phone]) T-2 View CXR (71020TC) Cardiolite (Cardiolite) Echo Referral (Echo) Specimen Handling (27062) Radiology Referral (Radiology) EKG w/ Interpretation (93000)  Recommended fluid and salt restriction.   Problem # 3:  CHEST PAIN, EXERTIONAL (ICD-786.50)  Orders: TLB-Cardiac Panel ([id number]) T-D-Dimer Fibrin Derivatives Quantitive [phone]) T- * Misc. 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 Laboratory test [phone]) EKG w/ Interpretation (93000)  Problem # 6:  HIATAL HERNIA WITH REFLUX (ICD-553.3)  Orders: EKG w/ Interpretation (93000)  Complete Medication List: 1)  Aciphex 20 Mg Tbec (Rabeprazole sodium) .Marland Kitchen.. 1 by mouth once daily 2)  Multivitamins Tabs (Multiple vitamin) .... Once daily 3)  Vitamin D3 5000 Unit/ml Liqd (Cholecalciferol) .... Take once daily 4)  Loestrin 1/20 (21) 1-20 Mg-mcg Tabs (Norethindrone acet-ethinyl est) .... Take as directed 5)  Omeprazole 20 Mg Cpdr (Omeprazole) .Marland Kitchen.. 1 by mouth once daily.  Other  Orders: T-Culture, Urine ([id number]) UA Dipstick w/o Micro (manual) (10258) Prescriptions: OMEPRAZOLE 20 MG CPDR (OMEPRAZOLE) 1 by mouth once daily.  #90 x 3   Entered and Authorized by:   Loreen Freud DO   Signed by:   Loreen Freud DO on 10/15/2009   Method used:   [facility]        [facility] [phone][facility] (retail)       [address]       [place], [place]  [address]       Ph: [phone]       Fax: [phone]   RxID:   [id number]    EKG  Procedure date:  10/15/2009  Findings:      Normal sinus rhythm with rate of:  62 bpm    Flu Vaccine Next Due:  Refused PAP Result Date:  09/24/2009 PAP Result:  normal PAP Next Due:  1 yr Mammogram Result Date:  01/30/2009 Mammogram Result:  normal Mammogram Next Due:  1 yr  Laboratory Results   Urine Tests   Date/Time Reported: October 15, 2009 2:27 PM   Routine Urinalysis   Color: yellow Appearance: Clear Glucose: negative   (Normal Range: Negative) Bilirubin: negative   (Normal Range: Negative) Ketone: negative   (Normal Range: Negative) Spec. Gravity: <1.005   (Normal Range: 1.003-1.035) Blood: large   (Normal Range: Negative) pH: 5.0   (Normal Range: 5.0-8.0) Protein: negative   (Normal Range: Negative) Urobilinogen: negative   (Normal Range: 0-1) Nitrite: negative   (Normal Range: Negative) Leukocyte Esterace: negative   (Normal Range: Negative)    Comments: Floydene Flock  October 15, 2009 2:27 PM cx sent

## 2010-03-19 ENCOUNTER — Encounter: Payer: Self-pay | Admitting: Family Medicine

## 2010-03-25 ENCOUNTER — Telehealth (INDEPENDENT_AMBULATORY_CARE_PROVIDER_SITE_OTHER): Payer: Self-pay | Admitting: *Deleted

## 2010-03-31 ENCOUNTER — Other Ambulatory Visit: Payer: Self-pay | Admitting: Women's Health

## 2010-03-31 DIAGNOSIS — Z1231 Encounter for screening mammogram for malignant neoplasm of breast: Secondary | ICD-10-CM

## 2010-04-03 NOTE — Progress Notes (Signed)
Summary: refill  Phone Note Refill Request Message from:  Fax from Pharmacy on March 25, 2010 9:45 AM  Refills Requested: Medication #1:  OMEPRAZOLE 20 MG CPDR 1 by mouth once daily.[facility] home delivery - fax [phone]  Initial call taken by: Okey Regal [place],  March 25, 2010 9:46 AM    Prescriptions: OMEPRAZOLE 20 MG CPDR (OMEPRAZOLE) 1 by mouth once daily.  #90 x 1   Entered by:   Almeta Monas CMA (AAMA)   Authorized by:   Loreen Freud DO   Signed by:   Almeta Monas CMA (AAMA) on 03/25/2010   Method used:   Faxed to ...       [address] [facility] (mail-order)       [address]       [place], [place]  [address]       Ph: [phone]       Fax: [phone]   RxID:   [id number]

## 2010-04-08 LAB — SURGICAL PCR SCREEN
MRSA, PCR: NEGATIVE
Staphylococcus aureus: NEGATIVE

## 2010-04-08 LAB — URINALYSIS, ROUTINE W REFLEX MICROSCOPIC
Bilirubin Urine: NEGATIVE
Glucose, UA: NEGATIVE mg/dL
Hgb urine dipstick: NEGATIVE

## 2010-04-08 LAB — BASIC METABOLIC PANEL
CO2: 25 mEq/L (ref 19–32)
Calcium: 10.6 mg/dL — ABNORMAL HIGH (ref 8.4–10.5)
Creatinine, Ser: 1.02 mg/dL (ref 0.4–1.2)
GFR calc Af Amer: >60 mL/min (ref >60)
GFR calc non Af Amer: 57 mL/min — ABNORMAL LOW (ref >60)
Glucose, Bld: 92 mg/dL (ref 70–99)
Sodium: 139 mEq/L (ref 135–145)

## 2010-04-08 LAB — CBC
Hemoglobin: 12.9 g/dL (ref 12.0–15.0)
MCH: 30.6 pg (ref 26.0–34.0)
MCHC: 35 g/dL (ref 30.0–36.0)
Platelets: 281 10*3/uL (ref 150–400)

## 2010-04-08 LAB — PROTIME-INR
INR: 1.02 (ref 0.00–1.49)
Prothrombin Time: 13.6 seconds (ref 11.6–15.2)

## 2010-04-08 LAB — DIFFERENTIAL
Basophils Absolute: 0 10*3/uL (ref 0.0–0.1)
Basophils Relative: 0 % (ref 0–1)
Eosinophils Absolute: 0.1 10*3/uL (ref 0.0–0.7)
Neutro Abs: 2.2 10*3/uL (ref 1.7–7.7)
Neutrophils Relative %: 43 % (ref 43–77)

## 2010-04-08 NOTE — Letter (Signed)
Summary: [facility]   Imported By: Maryln Gottron 04/01/2010 12:26:31  _____________________________________________________________________  External Attachment:    Type:   Image     Comment:   External Document

## 2010-04-09 ENCOUNTER — Other Ambulatory Visit: Payer: Self-pay | Admitting: Gynecology

## 2010-04-09 ENCOUNTER — Ambulatory Visit
Admission: RE | Admit: 2010-04-09 | Discharge: 2010-04-09 | Disposition: A | Discharge status: Home / Self Care | Payer: Commercial Indemnity | Source: Ambulatory Visit | Attending: Women's Health | Admitting: Women's Health

## 2010-04-09 DIAGNOSIS — Z1231 Encounter for screening mammogram for malignant neoplasm of breast: Secondary | ICD-10-CM

## 2010-04-11 ENCOUNTER — Other Ambulatory Visit: Payer: Self-pay | Admitting: Gynecology

## 2010-04-11 DIAGNOSIS — R928 Other abnormal and inconclusive findings on diagnostic imaging of breast: Secondary | ICD-10-CM

## 2010-04-16 ENCOUNTER — Ambulatory Visit
Admission: RE | Admit: 2010-04-16 | Discharge: 2010-04-16 | Disposition: A | Discharge status: Home / Self Care | Payer: Commercial Indemnity | Source: Ambulatory Visit | Attending: Gynecology | Admitting: Gynecology

## 2010-04-16 DIAGNOSIS — R928 Other abnormal and inconclusive findings on diagnostic imaging of breast: Secondary | ICD-10-CM

## 2010-06-13 NOTE — Op Note (Signed)
[facility]  Patient:    Gwendolyn Weaver, Gwendolyn Weaver                      MRN: [id number] Proc. Date: 08/18/00 Attending:  Fayrene Fearing A. Ashley Royalty, M.D.                           Operative Report  PREOPERATIVE DIAGNOSIS:       Desire for attempt at permanent sterilization.  POSTOPERATIVE DIAGNOSES:      1. Desire for attempt at permanent                                  sterilization.                               2. Left ovarian cyst - 1.5 to 2 cm in diameter -                                  probably physiologic.  OPERATION:                    Laparoscopic bilateral tubal sterilization                               procedure (fallopian rings).  SURGEON:                      Rudy Jew. Ashley Royalty, M.D.  ANESTHESIA:                   General.  ESTIMATED BLOOD LOSS:         Less than 25 cc.  COMPLICATIONS:                None.  PACKS AND DRAINS:             None.  DESCRIPTION OF PROCEDURE:     The patient was taken to the operating room and placed in the dorsal supine position.  After adequate general endotracheal anesthesia was administered, she was placed in the lithotomy position, and prepped and draped in the usual manner for abdominal and vaginal surgery.  A posterior weighted retractor was placed per vagina and the anterior lip of the cervix was grasped with a single tooth tenaculum.  A Jarcho uterine manipulator was placed per cervix and held in place with the tenaculum.  The bladder was drained with a red rubber catheter.  A 1.5 cm infraumbilical incision was made in a longitudinal plane.  The size 10-11 disposable laparoscopic trocar was placed into the abdominal cavity.  Its location was verified by placement of the laparoscope.  There was no evidence of any trauma.  Pneumoperitoneum was created and maintained throughout with CO2.  A thorough survey of the pelvic cavity was conducted.  The uterus was normal size, shape, and contour without evidence of  any endometriosis or fibroids. The fallopian tubes bilaterally are normal size, shape, contour, and length with luxuriant fimbria.  The right ovary is normal size, shape, and contour without evidence of any endometriosis or cyst.  The left ovary was slightly enlarged to approximately 3 x 4 x 2 cm and appeared to contain approximately 1.5 cm  simple-appearing cyst.  There was no evidence of any endometriosis. Pelvic survey was conducted with a 5 mm suprapubic port placed in the midline abdominal scar.  It was placed with transillumination and direct visualization techniques.  Appropriate photos were obtained.  Next, the fallopian ring trocar was placed through the same incision in the suprapubic region.  The right fallopian tube was grasped at the distal isthmic to proximal ampullary portion.  The fallopian ring was applied without difficulty.  An excellent knuckle of tube was noted to be contained within the ring.  Excellent blanching of tissue was noted.  Next, the left fallopian tube was grasped similarly and traced to its fimbriated end.  An avascular area in the distal isthmic to proximal ampullary portion was chose for ring placement.  The fallopian ring was applied without difficulty.  An excellent knuckle of tube was noted to be contained within the ring.  Excellent blanching of tissue was noted.  At this point, the patient was felt to have benefited maximally from the surgical procedure.  The abdominal instruments were removed and pneumoperitoneum evacuated.  The fascial defects were closed with 0 Vicryl in an interrupted fashion.  The skin was closed with 3-0 chromic in a subcuticular fashion.  Approximately 5 cc of 0.25% Marcaine was instilled into the abdominal incisions to aid in postoperative analgesia.  The vaginal instruments were removed, hemostasis noted, and the procedure terminated.  The patient was sent to the recovery room in excellent condition. DD:  08/18/00 TD:   08/19/00 Job: [id number]

## 2010-06-13 NOTE — H&P (Signed)
[facility]  Patient:    Gwendolyn Weaver, Gwendolyn Weaver                      MRN: [id number] Adm. Date:  08/18/00 Dictator:   Rudy Jew. Ashley Royalty, M.D.                         History and Physical  HISTORY OF PRESENT ILLNESS:   This is a 50 year old gravida 2, para 2-0-1-1, who states a desire for attempt at permanent surgical sterilization.  MEDICATIONS:                  Loestrin Fe 1.5/30.  PAST MEDICAL HISTORY:         Medical: Sickle trait.  Surgical: C-section.  ALLERGIES:                    None.  FAMILY HISTORY:               Positive for diabetes.  SOCIAL HISTORY:               The patient denies the use of tobacco or significant alcohol.  REVIEW OF SYSTEMS:            Noncontributory.  PHYSICAL EXAMINATION:  GENERAL:                      A well-developed, well-nourished, pleasant black female in no acute distress.  VITAL SIGNS:                  Afebrile, vital signs stable.  SKIN:                         Warm and dry without lesions.  LYMPH:                        There is no supraclavicular, cervical or inguinal adenopathy.  HEENT:                        Normocephalic.  NECK:                         Supple without thyromegaly.  CHEST:                        Lungs are clear.  CARDIAC:                      Regular rate and rhythm without murmurs, gallops or rubs.  BREAST EXAM:                  Deferred.  ABDOMEN:                      Soft and nontender without masses or organomegaly.  There is a longitudinal surgical scar which has an appearance consistent with having healed by secondary intention.  No evidence of herniae. Bowel sounds active.  MUSCULOSKELETAL EXAMINATION:  Reveals full range of motion without edema, cyanosis or CVA tenderness.  PELVIC EXAMINATION:           Deferred until examination under anesthesia.  IMPRESSION:                   1. Desire for attempt at permanent surgical  sterilization.  2. Sickle trait.                               3. Status post cesarean section with history of                                  dehiscence and appearance consistent with                                  having healed by secondary intention.  PLAN:                         Laparoscopic bilateral tubal sterilization procedure.  The risks, benefits, complications and alternatives were fully discussed with the patient.  Permanency and failure rates and various techniques including, but not limited to, bipolar cautery, mini-laparotomy with partial salpingectomy and Falope ring discussed and accepted.  Questions invited and answered. DD:  08/18/00 TD:  08/18/00 Job: [id number]

## 2010-09-16 ENCOUNTER — Other Ambulatory Visit: Payer: Self-pay | Admitting: Family Medicine

## 2010-09-17 NOTE — Telephone Encounter (Signed)
mssg from patient returning my call---- I left another mssg for her to call back      KP

## 2010-09-18 MED ORDER — OMEPRAZOLE 20 MG PO CPDR: 20.0000 mg | DELAYED_RELEASE_CAPSULE | Freq: Every day | ORAL | Status: DC

## 2010-10-09 ENCOUNTER — Other Ambulatory Visit: Payer: Self-pay | Admitting: Women's Health

## 2010-10-16 DIAGNOSIS — IMO0002 Reserved for concepts with insufficient information to code with codable children: Secondary | ICD-10-CM | POA: Insufficient documentation

## 2010-10-22 ENCOUNTER — Encounter: Payer: Self-pay | Admitting: Women's Health

## 2010-10-22 ENCOUNTER — Other Ambulatory Visit (HOSPITAL_COMMUNITY)
Admission: RE | Admit: 2010-10-22 | Discharge: 2010-10-22 | Disposition: A | Discharge status: Home / Self Care | Payer: Commercial Indemnity | Source: Ambulatory Visit | Attending: Gynecology | Admitting: Gynecology

## 2010-10-22 ENCOUNTER — Ambulatory Visit (INDEPENDENT_AMBULATORY_CARE_PROVIDER_SITE_OTHER): Payer: Commercial Indemnity | Admitting: Women's Health

## 2010-10-22 VITALS — BP 130/88 | Ht 66.25 in | Wt 194.0 lb

## 2010-10-22 DIAGNOSIS — N760 Acute vaginitis: Secondary | ICD-10-CM

## 2010-10-22 DIAGNOSIS — N898 Other specified noninflammatory disorders of vagina: Secondary | ICD-10-CM

## 2010-10-22 DIAGNOSIS — Z01419 Encounter for gynecological examination (general) (routine) without abnormal findings: Secondary | ICD-10-CM | POA: Insufficient documentation

## 2010-10-22 DIAGNOSIS — A499 Bacterial infection, unspecified: Secondary | ICD-10-CM

## 2010-10-22 DIAGNOSIS — B9689 Other specified bacterial agents as the cause of diseases classified elsewhere: Secondary | ICD-10-CM

## 2010-10-22 DIAGNOSIS — Z309 Encounter for contraceptive management, unspecified: Secondary | ICD-10-CM

## 2010-10-22 DIAGNOSIS — IMO0001 Reserved for inherently not codable concepts without codable children: Secondary | ICD-10-CM

## 2010-10-22 MED ORDER — NORETHIN ACE-ETH ESTRAD-FE 1-20 MG-MCG PO TABS: 1.0000 | ORAL_TABLET | Freq: Every day | ORAL | Status: DC

## 2010-10-22 MED ORDER — FLUCONAZOLE 150 MG PO TABS: 150.0000 mg | ORAL_TABLET | Freq: Once | ORAL | Status: AC

## 2010-10-22 MED ORDER — METRONIDAZOLE 0.75 % VA GEL: 1.0000 | VAGINAL | Status: AC

## 2010-10-22 NOTE — Progress Notes (Signed)
Gwendolyn Weaver 11-11-1960 [id number]    History:    The patient presents for annual exam.  Works as a Merchandiser, retail in [employer]. 52 year old son Gwendolyn Weaver doing well at [employer] in [employer].   Past medical history, past surgical history, family history and social history were all reviewed and documented in the EPIC chart.   ROS:  A  ROS was performed and pertinent positives and negatives are included in the history.  Exam:  Filed Vitals:   10/22/10 0838  BP: 130/88    General appearance:  Normal Head/Neck:  Normal, without cervical or supraclavicular adenopathy. Thyroid:  Symmetrical, normal in size, without palpable masses or nodularity. Respiratory  Effort:  Normal  Auscultation:  Clear without wheezing or rhonchi Cardiovascular  Auscultation:  Regular rate, without rubs, murmurs or gallops  Edema/varicosities:  Not grossly evident Abdominal  Soft,nontender, without masses, guarding or rebound.  Liver/spleen:  No organomegaly noted  Hernia:  None appreciated  Skin  Inspection:  Grossly normal  Palpation:  Grossly normal Neurologic/psychiatric  Orientation:  Normal with appropriate conversation.  Mood/affect:  Normal  Genitourinary    Breasts: Examined lying and sitting.     Right: Without masses, retractions, discharge or axillary adenopathy.     Left: Without masses, retractions, discharge or axillary adenopathy.   Inguinal/mons:  Normal without inguinal adenopathy  External genitalia:  Normal  BUS/Urethra/Skene's glands:  Normal  Bladder:  Normal  Vagina:  Normal  Cervix:  Normal  Uterus:   normal in size, shape and contour.  Midline and mobile  Adnexa/parametria:     Rt: Without masses or tenderness.   Lt: Without masses or tenderness.  Anus and perineum: Normal  Digital rectal exam: Normal sphincter tone without palpated masses or tenderness  Assessment/Plan:  50 y.o.DBF G2P1  for annual exam without complaint.  3-4 day light monthly cycle on Junel,  history of a  BTL on OCs for menorrhagia. Occasional night sweats , will check a FSH. 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 If Pap normal will recheck in 6 months.  Perimenopause, BV and yeast noted on wet prep  Plan: Diflucan 150 by mouth x1 dose with a refill, MetroGel vaginal cream 1 applicator at bedtime x5 prescriptions for both were given reviewed alcohol precautions were reviewed. SBEs annual mammogram, calcium rich diet, increase exercise encouraged. Labs are at primary care Pap and [name] only today. Reviewed if  [name] is elevated will change to a HRT did review slight risk for blood clots, strokes and breast cancer.    [name] [name], 9:23 AM 10/22/2010

## 2011-04-28 ENCOUNTER — Other Ambulatory Visit: Payer: Self-pay | Admitting: Gynecology

## 2011-04-28 DIAGNOSIS — Z1231 Encounter for screening mammogram for malignant neoplasm of breast: Secondary | ICD-10-CM

## 2011-05-05 ENCOUNTER — Ambulatory Visit
Admission: RE | Admit: 2011-05-05 | Discharge: 2011-05-05 | Disposition: A | Discharge status: Home / Self Care | Payer: Commercial Indemnity | Source: Ambulatory Visit | Attending: Gynecology | Admitting: Gynecology

## 2011-05-05 DIAGNOSIS — Z1231 Encounter for screening mammogram for malignant neoplasm of breast: Secondary | ICD-10-CM

## 2011-09-11 ENCOUNTER — Telehealth: Payer: Self-pay | Admitting: Family Medicine

## 2011-09-11 MED ORDER — OMEPRAZOLE 20 MG PO CPDR: DELAYED_RELEASE_CAPSULE | ORAL | Status: DC

## 2011-09-11 NOTE — Telephone Encounter (Signed)
Refill: Prilosec cap dr 20mg .

## 2011-11-06 ENCOUNTER — Other Ambulatory Visit: Payer: Self-pay | Admitting: Women's Health

## 2011-11-18 ENCOUNTER — Other Ambulatory Visit (HOSPITAL_COMMUNITY)
Admission: RE | Admit: 2011-11-18 | Discharge: 2011-11-18 | Disposition: A | Discharge status: Home / Self Care | Payer: Managed Care, Other (non HMO) | Source: Ambulatory Visit | Attending: Women's Health | Admitting: Women's Health

## 2011-11-18 ENCOUNTER — Encounter: Payer: Self-pay | Admitting: Women's Health

## 2011-11-18 ENCOUNTER — Ambulatory Visit (INDEPENDENT_AMBULATORY_CARE_PROVIDER_SITE_OTHER): Payer: Managed Care, Other (non HMO) | Admitting: Women's Health

## 2011-11-18 VITALS — BP 140/98 | Ht 66.75 in | Wt 195.0 lb

## 2011-11-18 DIAGNOSIS — Z1151 Encounter for screening for human papillomavirus (HPV): Secondary | ICD-10-CM | POA: Insufficient documentation

## 2011-11-18 DIAGNOSIS — Z01419 Encounter for gynecological examination (general) (routine) without abnormal findings: Secondary | ICD-10-CM | POA: Insufficient documentation

## 2011-11-18 NOTE — Progress Notes (Addendum)
Alaia Lordi 1961-01-18 [id number]    History:    The patient presents for annual exam.  Light 2 day cycle on Loestrin 1/20.  BTL - OCs for menorrhagia. History of LGSIL/CIN-1 with biopsy 2010 with normal Paps after. Normal mammograms. History of benign breast cysts. Negative colonoscopy 2010.   Past medical history, past surgical history, family history and social history were all reviewed and documented in the EPIC chart. Lamar 20 doing well in college. Planning marriage next year. Father and brothers with hypertension.   ROS:  A  ROS was performed and pertinent positives and negatives are included in the history.  Exam:  Filed Vitals:   11/18/11 0949  BP: 140/98    General appearance:  Normal Head/Neck:  Normal, without cervical or supraclavicular adenopathy. Thyroid:  Symmetrical, normal in size, without palpable masses or nodularity. Respiratory  Effort:  Normal  Auscultation:  Clear without wheezing or rhonchi Cardiovascular  Auscultation:  Regular rate, without rubs, murmurs or gallops  Edema/varicosities:  Not grossly evident Abdominal  Soft,nontender, without masses, guarding or rebound.  Liver/spleen:  No organomegaly noted  Hernia:  None appreciated  Skin  Inspection:  Grossly normal  Palpation:  Grossly normal Neurologic/psychiatric  Orientation:  Normal with appropriate conversation.  Mood/affect:  Normal  Genitourinary    Breasts: Examined lying and sitting.     Right: Without masses, retractions, discharge or axillary adenopathy.     Left: Without masses, retractions, discharge or axillary adenopathy.   Inguinal/mons:  Normal without inguinal adenopathy  External genitalia:  Normal  BUS/Urethra/Skene's glands:  Normal  Bladder:  Normal  Vagina:  Normal  Cervix:  Normal  Uterus:   normal in size, shape and contour.  Midline and mobile  Adnexa/parametria:     Rt: Without masses or tenderness.   Lt: Without masses or tenderness.  Anus and  perineum: Normal  Digital rectal exam: Normal sphincter tone without palpated masses or tenderness  Assessment/Plan:  51 y.o. DBF G2 P1  for annual exam with no complaints.  Blood pressure elevated  History of LG SIL/CIN-1 2010  Plan: Stop Loestrin 1/20 after current pack, return to office in 4-6 weeks for [facility], recheck blood pressure, fasting glucose and lipid panel. SBE's, continue annual mammogram, calcium rich diet, vitamin D 2000 daily. UA,HHC, Pap, reviewed if Pap normal with negative HR HPV will Pap in 5 years. Reviewed importance of exercise, decreasing weight for blood pressure and general health.    [name] WHNP, 1:29 PM 11/18/2011

## 2011-11-18 NOTE — Addendum Note (Signed)
Addended by: YOUNG, NANCY J on: 11/18/2011 05:29 PM   Modules accepted: Level of Service  

## 2011-11-18 NOTE — Patient Instructions (Signed)

## 2011-11-19 ENCOUNTER — Encounter: Payer: Commercial Indemnity | Admitting: Women's Health

## 2011-11-19 LAB — URINALYSIS W MICROSCOPIC + REFLEX CULTURE
Bacteria, UA: NONE SEEN
Casts: NONE SEEN
Hgb urine dipstick: NEGATIVE
Ketones, ur: NEGATIVE mg/dL
Leukocytes, UA: NEGATIVE
Nitrite: NEGATIVE
Protein, ur: NEGATIVE mg/dL
Urobilinogen, UA: 0.2 mg/dL (ref 0.0–1.0)
pH: 5.5 (ref 5.0–8.0)

## 2011-11-23 ENCOUNTER — Other Ambulatory Visit: Payer: Self-pay | Admitting: Women's Health

## 2011-11-23 MED ORDER — FLUCONAZOLE 150 MG PO TABS: 150.0000 mg | ORAL_TABLET | Freq: Once | ORAL | Status: DC

## 2011-12-09 ENCOUNTER — Telehealth: Payer: Self-pay | Admitting: Family Medicine

## 2011-12-09 NOTE — Telephone Encounter (Signed)
No refill

## 2011-12-09 NOTE — Telephone Encounter (Signed)
refill PRILOSEC 20 MG 1 Capsule by mouth daily. --last wrt 8.16.13 #90--last ov--wt/labs 10.23.13 V72.31

## 2011-12-09 NOTE — Telephone Encounter (Signed)
This patient has not been seen since 01/02/2010. Please advise     KP

## 2012-01-01 ENCOUNTER — Other Ambulatory Visit: Payer: Managed Care, Other (non HMO)

## 2012-01-08 ENCOUNTER — Other Ambulatory Visit: Payer: Self-pay | Admitting: *Deleted

## 2012-01-08 ENCOUNTER — Encounter: Payer: Self-pay | Admitting: *Deleted

## 2012-01-08 ENCOUNTER — Other Ambulatory Visit: Payer: Managed Care, Other (non HMO)

## 2012-01-08 DIAGNOSIS — N951 Menopausal and female climacteric states: Secondary | ICD-10-CM

## 2012-01-08 DIAGNOSIS — Z13220 Encounter for screening for lipoid disorders: Secondary | ICD-10-CM

## 2012-01-08 DIAGNOSIS — Z131 Encounter for screening for diabetes mellitus: Secondary | ICD-10-CM

## 2012-01-08 LAB — LIPID PANEL
Cholesterol: 246 mg/dL — ABNORMAL HIGH (ref 0–200)
HDL: 50 mg/dL (ref >39)

## 2012-01-08 LAB — GLUCOSE, RANDOM: Glucose, Bld: 82 mg/dL (ref 70–99)

## 2012-01-08 LAB — FOLLICLE STIMULATING HORMONE: FSH: 69.7 m[IU]/mL

## 2012-01-08 NOTE — Progress Notes (Unsigned)
Patient ID: Gwendolyn Weaver, female   DOB: 13-Nov-1960, 51 y.o.   MRN: [id number] Recheck blood pressure per [facility]

## 2012-04-30 ENCOUNTER — Telehealth: Payer: Self-pay | Admitting: Obstetrics and Gynecology

## 2012-04-30 NOTE — Telephone Encounter (Signed)
52 yo BF called c/o onset of menses yesterday after no menses since Nov 2013.  FSH then was c/w menopause.  Menses is heavy, changing a pad about every 30 minutes.  Exam in October 2013 showed the uterus was normal size and shape.  Was on OC's for tx of menorrhagia, but quit them last fall when [facility] indicated menopause.  Rec:  Just observe for now, increase po fluids, call back if heavy bleeding persists.  She may just be having what would be a normal period off OC's for her.

## 2012-05-02 NOTE — Telephone Encounter (Signed)
Telephone call, stopped birth control pills in October, elevated [facility] after stopping pills. BTL, was on birth control pills to help with duration of cycles which did help. No bleeding until 4/4, became heavy, bleeding through pads in less than an hour for 2 days, cycle has basically stopped today. Has not been sexually active. Will watch at this time, if any further bleeding office visit, ultrasound. Reviewed importance of followup if any further bleeding.

## 2012-05-02 NOTE — Telephone Encounter (Signed)
Message left

## 2012-05-13 ENCOUNTER — Encounter: Payer: Self-pay | Admitting: Family Medicine

## 2012-05-13 ENCOUNTER — Ambulatory Visit (INDEPENDENT_AMBULATORY_CARE_PROVIDER_SITE_OTHER): Payer: Managed Care, Other (non HMO) | Admitting: Family Medicine

## 2012-05-13 VITALS — BP 122/84 | HR 64 | Temp 98.1°F | Resp 16 | Ht 67.0 in | Wt 200.1 lb

## 2012-05-13 DIAGNOSIS — E785 Hyperlipidemia, unspecified: Secondary | ICD-10-CM

## 2012-05-13 DIAGNOSIS — K219 Gastro-esophageal reflux disease without esophagitis: Secondary | ICD-10-CM

## 2012-05-13 DIAGNOSIS — Z0000 Encounter for general adult medical examination without abnormal findings: Secondary | ICD-10-CM

## 2012-05-13 LAB — CBC WITH DIFFERENTIAL/PLATELET
Basophils Relative: 0.4 % (ref 0.0–3.0)
Eosinophils Absolute: 0.1 10*3/uL (ref 0.0–0.7)
Hemoglobin: 11.8 g/dL — ABNORMAL LOW (ref 12.0–15.0)
MCHC: 33.8 g/dL (ref 30.0–36.0)
MCV: 89.4 fl (ref 78.0–100.0)
Monocytes Absolute: 0.4 10*3/uL (ref 0.1–1.0)
Neutro Abs: 2.9 10*3/uL (ref 1.4–7.7)
Neutrophils Relative %: 47.3 % (ref 43.0–77.0)
RBC: 3.91 Mil/uL (ref 3.87–5.11)
RDW: 13.7 % (ref 11.5–14.6)

## 2012-05-13 LAB — POCT URINALYSIS DIPSTICK
Bilirubin, UA: NEGATIVE
Glucose, UA: NEGATIVE
Ketones, UA: NEGATIVE
Leukocytes, UA: NEGATIVE
Protein, UA: NEGATIVE

## 2012-05-13 LAB — TSH: TSH: 1.69 u[IU]/mL (ref 0.35–5.50)

## 2012-05-13 LAB — BASIC METABOLIC PANEL
CO2: 28 mEq/L (ref 19–32)
Chloride: 103 mEq/L (ref 96–112)
Creatinine, Ser: 1 mg/dL (ref 0.4–1.2)
Glucose, Bld: 76 mg/dL (ref 70–99)

## 2012-05-13 LAB — HEPATIC FUNCTION PANEL
Albumin: 3.8 g/dL (ref 3.5–5.2)
Total Protein: 7.2 g/dL (ref 6.0–8.3)

## 2012-05-13 LAB — LIPID PANEL
HDL: 50.9 mg/dL (ref >39.00)
Triglycerides: 104 mg/dL (ref 0.0–149.0)

## 2012-05-13 MED ORDER — OMEPRAZOLE 20 MG PO CPDR: DELAYED_RELEASE_CAPSULE | ORAL | Status: DC

## 2012-05-13 NOTE — Patient Instructions (Addendum)
Preventive Care for Adults, Female A healthy lifestyle and preventive care can promote health and wellness. Preventive health guidelines for women include the following key practices.  A routine yearly physical is a good way to check with your caregiver about your health and preventive screening. It is a chance to share any concerns and updates on your health, and to receive a thorough exam.  Visit your dentist for a routine exam and preventive care every 6 months. Brush your teeth twice a day and floss once a day. Good oral hygiene prevents tooth decay and gum disease.  The frequency of eye exams is based on your age, health, family medical history, use of contact lenses, and other factors. Follow your caregiver's recommendations for frequency of eye exams.  Eat a healthy diet. Foods like vegetables, fruits, whole grains, low-fat dairy products, and lean protein foods contain the nutrients you need without too many calories. Decrease your intake of foods high in solid fats, added sugars, and salt. Eat the right amount of calories for you.Get information about a proper diet from your caregiver, if necessary.  Regular physical exercise is one of the most important things you can do for your health. Most adults should get at least 150 minutes of moderate-intensity exercise (any activity that increases your heart rate and causes you to sweat) each week. In addition, most adults need muscle-strengthening exercises on 2 or more days a week.  Maintain a healthy weight. The body mass index (BMI) is a screening tool to identify possible weight problems. It provides an estimate of body fat based on height and weight. Your caregiver can help determine your BMI, and can help you achieve or maintain a healthy weight.For adults 20 years and older:  A BMI below 18.5 is considered underweight.  A BMI of 18.5 to 24.9 is normal.  A BMI of 25 to 29.9 is considered overweight.  A BMI of 30 and above is  considered obese.  Maintain normal blood lipids and cholesterol levels by exercising and minimizing your intake of saturated fat. Eat a balanced diet with plenty of fruit and vegetables. Blood tests for lipids and cholesterol should begin at age 20 and be repeated every 5 years. If your lipid or cholesterol levels are high, you are over 50, or you are at high risk for heart disease, you may need your cholesterol levels checked more frequently.Ongoing high lipid and cholesterol levels should be treated with medicines if diet and exercise are not effective.  If you smoke, find out from your caregiver how to quit. If you do not use tobacco, do not start.  If you are pregnant, do not drink alcohol. If you are breastfeeding, be very cautious about drinking alcohol. If you are not pregnant and choose to drink alcohol, do not exceed 1 drink per day. One drink is considered to be 12 ounces (355 mL) of beer, 5 ounces (148 mL) of wine, or 1.5 ounces (44 mL) of liquor.  Avoid use of street drugs. Do not share needles with anyone. Ask for help if you need support or instructions about stopping the use of drugs.  High blood pressure causes heart disease and increases the risk of stroke. Your blood pressure should be checked at least every 1 to 2 years. Ongoing high blood pressure should be treated with medicines if weight loss and exercise are not effective.  If you are 55 to 54 years old, ask your caregiver if you should take aspirin to prevent strokes.  Diabetes   screening involves taking a blood sample to check your fasting blood sugar level. This should be done once every 3 years, after age 45, if you are within normal weight and without risk factors for diabetes. Testing should be considered at a younger age or be carried out more frequently if you are overweight and have at least 1 risk factor for diabetes.  Breast cancer screening is essential preventive care for women. You should practice "breast  self-awareness." This means understanding the normal appearance and feel of your breasts and may include breast self-examination. Any changes detected, no matter how small, should be reported to a caregiver. Women in their 20s and 30s should have a clinical breast exam (CBE) by a caregiver as part of a regular health exam every 1 to 3 years. After age 40, women should have a CBE every year. Starting at age 40, women should consider having a mammography (breast X-ray test) every year. Women who have a family history of breast cancer should talk to their caregiver about genetic screening. Women at a high risk of breast cancer should talk to their caregivers about having magnetic resonance imaging (MRI) and a mammography every year.  The Pap test is a screening test for cervical cancer. A Pap test can show cell changes on the cervix that might become cervical cancer if left untreated. A Pap test is a procedure in which cells are obtained and examined from the lower end of the uterus (cervix).  Women should have a Pap test starting at age 21.  Between ages 21 and 29, Pap tests should be repeated every 2 years.  Beginning at age 30, you should have a Pap test every 3 years as long as the past 3 Pap tests have been normal.  Some women have medical problems that increase the chance of getting cervical cancer. Talk to your caregiver about these problems. It is especially important to talk to your caregiver if a new problem develops soon after your last Pap test. In these cases, your caregiver may recommend more frequent screening and Pap tests.  The above recommendations are the same for women who have or have not gotten the vaccine for human papillomavirus (HPV).  If you had a hysterectomy for a problem that was not cancer or a condition that could lead to cancer, then you no longer need Pap tests. Even if you no longer need a Pap test, a regular exam is a good idea to make sure no other problems are  starting.  If you are between ages 65 and 70, and you have had normal Pap tests going back 10 years, you no longer need Pap tests. Even if you no longer need a Pap test, a regular exam is a good idea to make sure no other problems are starting.  If you have had past treatment for cervical cancer or a condition that could lead to cancer, you need Pap tests and screening for cancer for at least 20 years after your treatment.  If Pap tests have been discontinued, risk factors (such as a new sexual partner) need to be reassessed to determine if screening should be resumed.  The HPV test is an additional test that may be used for cervical cancer screening. The HPV test looks for the virus that can cause the cell changes on the cervix. The cells collected during the Pap test can be tested for HPV. The HPV test could be used to screen women aged 30 years and older, and should   be used in women of any age who have unclear Pap test results. After the age of 30, women should have HPV testing at the same frequency as a Pap test.  Colorectal cancer can be detected and often prevented. Most routine colorectal cancer screening begins at the age of 50 and continues through age 75. However, your caregiver may recommend screening at an earlier age if you have risk factors for colon cancer. On a yearly basis, your caregiver may provide home test kits to check for hidden blood in the stool. Use of a small camera at the end of a tube, to directly examine the colon (sigmoidoscopy or colonoscopy), can detect the earliest forms of colorectal cancer. Talk to your caregiver about this at age 50, when routine screening begins. Direct examination of the colon should be repeated every 5 to 10 years through age 75, unless early forms of pre-cancerous polyps or small growths are found.  Hepatitis C blood testing is recommended for all people born from 1945 through 1965 and any individual with known risks for hepatitis C.  Practice  safe sex. Use condoms and avoid high-risk sexual practices to reduce the spread of sexually transmitted infections (STIs). STIs include gonorrhea, chlamydia, syphilis, trichomonas, herpes, HPV, and human immunodeficiency virus (HIV). Herpes, HIV, and HPV are viral illnesses that have no cure. They can result in disability, cancer, and death. Sexually active women aged 25 and younger should be checked for chlamydia. Older women with new or multiple partners should also be tested for chlamydia. Testing for other STIs is recommended if you are sexually active and at increased risk.  Osteoporosis is a disease in which the bones lose minerals and strength with aging. This can result in serious bone fractures. The risk of osteoporosis can be identified using a bone density scan. Women ages 65 and over and women at risk for fractures or osteoporosis should discuss screening with their caregivers. Ask your caregiver whether you should take a calcium supplement or vitamin D to reduce the rate of osteoporosis.  Menopause can be associated with physical symptoms and risks. Hormone replacement therapy is available to decrease symptoms and risks. You should talk to your caregiver about whether hormone replacement therapy is right for you.  Use sunscreen with sun protection factor (SPF) of 30 or more. Apply sunscreen liberally and repeatedly throughout the day. You should seek shade when your shadow is shorter than you. Protect yourself by wearing long sleeves, pants, a wide-brimmed hat, and sunglasses year round, whenever you are outdoors.  Once a month, do a whole body skin exam, using a mirror to look at the skin on your back. Notify your caregiver of new moles, moles that have irregular borders, moles that are larger than a pencil eraser, or moles that have changed in shape or color.  Stay current with required immunizations.  Influenza. You need a dose every fall (or winter). The composition of the flu vaccine  changes each year, so being vaccinated once is not enough.  Pneumococcal polysaccharide. You need 1 to 2 doses if you smoke cigarettes or if you have certain chronic medical conditions. You need 1 dose at age 65 (or older) if you have never been vaccinated.  Tetanus, diphtheria, pertussis (Tdap, Td). Get 1 dose of Tdap vaccine if you are younger than age 65, are over 65 and have contact with an infant, are a healthcare worker, are pregnant, or simply want to be protected from whooping cough. After that, you need a Td   booster dose every 10 years. Consult your caregiver if you have not had at least 3 tetanus and diphtheria-containing shots sometime in your life or have a deep or dirty wound.  HPV. You need this vaccine if you are a woman age 26 or younger. The vaccine is given in 3 doses over 6 months.  Measles, mumps, rubella (MMR). You need at least 1 dose of MMR if you were born in 1957 or later. You may also need a second dose.  Meningococcal. If you are age 19 to 21 and a first-year college student living in a residence hall, or have one of several medical conditions, you need to get vaccinated against meningococcal disease. You may also need additional booster doses.  Zoster (shingles). If you are age 60 or older, you should get this vaccine.  Varicella (chickenpox). If you have never had chickenpox or you were vaccinated but received only 1 dose, talk to your caregiver to find out if you need this vaccine.  Hepatitis A. You need this vaccine if you have a specific risk factor for hepatitis A virus infection or you simply wish to be protected from this disease. The vaccine is usually given as 2 doses, 6 to 18 months apart.  Hepatitis B. You need this vaccine if you have a specific risk factor for hepatitis B virus infection or you simply wish to be protected from this disease. The vaccine is given in 3 doses, usually over 6 months. Preventive Services / Frequency Ages 19 to 39  Blood  pressure check.** / Every 1 to 2 years.  Lipid and cholesterol check.** / Every 5 years beginning at age 20.  Clinical breast exam.** / Every 3 years for women in their 20s and 30s.  Pap test.** / Every 2 years from ages 21 through 29. Every 3 years starting at age 30 through age 65 or 70 with a history of 3 consecutive normal Pap tests.  HPV screening.** / Every 3 years from ages 30 through ages 65 to 70 with a history of 3 consecutive normal Pap tests.  Hepatitis C blood test.** / For any individual with known risks for hepatitis C.  Skin self-exam. / Monthly.  Influenza immunization.** / Every year.  Pneumococcal polysaccharide immunization.** / 1 to 2 doses if you smoke cigarettes or if you have certain chronic medical conditions.  Tetanus, diphtheria, pertussis (Tdap, Td) immunization. / A one-time dose of Tdap vaccine. After that, you need a Td booster dose every 10 years.  HPV immunization. / 3 doses over 6 months, if you are 26 and younger.  Measles, mumps, rubella (MMR) immunization. / You need at least 1 dose of MMR if you were born in 1957 or later. You may also need a second dose.  Meningococcal immunization. / 1 dose if you are age 19 to 21 and a first-year college student living in a residence hall, or have one of several medical conditions, you need to get vaccinated against meningococcal disease. You may also need additional booster doses.  Varicella immunization.** / Consult your caregiver.  Hepatitis A immunization.** / Consult your caregiver. 2 doses, 6 to 18 months apart.  Hepatitis B immunization.** / Consult your caregiver. 3 doses usually over 6 months. Ages 40 to 64  Blood pressure check.** / Every 1 to 2 years.  Lipid and cholesterol check.** / Every 5 years beginning at age 20.  Clinical breast exam.** / Every year after age 40.  Mammogram.** / Every year beginning at age 40   and continuing for as long as you are in good health. Consult with your  caregiver.  Pap test.** / Every 3 years starting at age 30 through age 65 or 70 with a history of 3 consecutive normal Pap tests.  HPV screening.** / Every 3 years from ages 30 through ages 65 to 70 with a history of 3 consecutive normal Pap tests.  Fecal occult blood test (FOBT) of stool. / Every year beginning at age 50 and continuing until age 75. You may not need to do this test if you get a colonoscopy every 10 years.  Flexible sigmoidoscopy or colonoscopy.** / Every 5 years for a flexible sigmoidoscopy or every 10 years for a colonoscopy beginning at age 50 and continuing until age 75.  Hepatitis C blood test.** / For all people born from 1945 through 1965 and any individual with known risks for hepatitis C.  Skin self-exam. / Monthly.  Influenza immunization.** / Every year.  Pneumococcal polysaccharide immunization.** / 1 to 2 doses if you smoke cigarettes or if you have certain chronic medical conditions.  Tetanus, diphtheria, pertussis (Tdap, Td) immunization.** / A one-time dose of Tdap vaccine. After that, you need a Td booster dose every 10 years.  Measles, mumps, rubella (MMR) immunization. / You need at least 1 dose of MMR if you were born in 1957 or later. You may also need a second dose.  Varicella immunization.** / Consult your caregiver.  Meningococcal immunization.** / Consult your caregiver.  Hepatitis A immunization.** / Consult your caregiver. 2 doses, 6 to 18 months apart.  Hepatitis B immunization.** / Consult your caregiver. 3 doses, usually over 6 months. Ages 65 and over  Blood pressure check.** / Every 1 to 2 years.  Lipid and cholesterol check.** / Every 5 years beginning at age 20.  Clinical breast exam.** / Every year after age 40.  Mammogram.** / Every year beginning at age 40 and continuing for as long as you are in good health. Consult with your caregiver.  Pap test.** / Every 3 years starting at age 30 through age 65 or 70 with a 3  consecutive normal Pap tests. Testing can be stopped between 65 and 70 with 3 consecutive normal Pap tests and no abnormal Pap or HPV tests in the past 10 years.  HPV screening.** / Every 3 years from ages 30 through ages 65 or 70 with a history of 3 consecutive normal Pap tests. Testing can be stopped between 65 and 70 with 3 consecutive normal Pap tests and no abnormal Pap or HPV tests in the past 10 years.  Fecal occult blood test (FOBT) of stool. / Every year beginning at age 50 and continuing until age 75. You may not need to do this test if you get a colonoscopy every 10 years.  Flexible sigmoidoscopy or colonoscopy.** / Every 5 years for a flexible sigmoidoscopy or every 10 years for a colonoscopy beginning at age 50 and continuing until age 75.  Hepatitis C blood test.** / For all people born from 1945 through 1965 and any individual with known risks for hepatitis C.  Osteoporosis screening.** / A one-time screening for women ages 65 and over and women at risk for fractures or osteoporosis.  Skin self-exam. / Monthly.  Influenza immunization.** / Every year.  Pneumococcal polysaccharide immunization.** / 1 dose at age 65 (or older) if you have never been vaccinated.  Tetanus, diphtheria, pertussis (Tdap, Td) immunization. / A one-time dose of Tdap vaccine if you are over   65 and have contact with an infant, are a healthcare worker, or simply want to be protected from whooping cough. After that, you need a Td booster dose every 10 years.  Varicella immunization.** / Consult your caregiver.  Meningococcal immunization.** / Consult your caregiver.  Hepatitis A immunization.** / Consult your caregiver. 2 doses, 6 to 18 months apart.  Hepatitis B immunization.** / Check with your caregiver. 3 doses, usually over 6 months. ** Family history and personal history of risk and conditions may change your caregiver's recommendations. Document Released: 03/10/2001 Document Revised: 04/06/2011  Document Reviewed: 06/09/2010 [employer] Patient Information 2013 [employer].  

## 2012-05-13 NOTE — Assessment & Plan Note (Signed)
Check labs 

## 2012-05-13 NOTE — Progress Notes (Signed)
Subjective:     Gwendolyn Weaver is a 52 y.o. female and is here for a comprehensive physical exam. The patient reports no problems.  History   Social History  . Marital Status: Divorced    Spouse Name: N/A    Number of Children: N/A  . Years of Education: N/A   Occupational History  . Not on file.   Social History Main Topics  . Smoking status: Never Smoker   . Smokeless tobacco: Never Used  . Alcohol Use: No  . Drug Use: No  . Sexually Active: Yes    Birth Control/ Protection: Pill   Other Topics Concern  . Not on file   Social History Narrative  . No narrative on file   Health Maintenance  Topic Date Due  . Influenza Vaccine  09/26/2012  . Mammogram  05/04/2013  . Pap Smear  11/18/2014  . Tetanus/tdap  07/01/2016  . 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 She  does not have any pertinent problems on file. She  has past surgical history that includes Cesarean section; Hemorrhoid surgery; Parathyroid exploration (12/2009); and Hemorrhoid surgery (80'S). Her family history includes Cancer (age of onset: 89) in her father and Hypertension in her father. She  reports that she has never smoked. She has never used smokeless tobacco. She reports that she does not drink alcohol or use illicit drugs. She has a current medication list which includes the following prescription(s): multivitamin and omeprazole. Current Outpatient Prescriptions on File Prior to Visit  Medication Sig Dispense Refill  . Multiple Vitamin (MULTIVITAMIN) tablet Take 1 tablet by mouth daily.         No current facility-administered medications on file prior to visit.   She has No Known Allergies..  Review of Systems Review of Systems  Constitutional: Negative for activity change, appetite change  and fatigue.  HENT: Negative for hearing loss, congestion, tinnitus and ear discharge.  dentist q2m Eyes: Negative for visual disturbance (see optho q1y -- vision corrected to 20/20 with glasses).  Respiratory: Negative for cough, chest tightness and shortness of breath.   Cardiovascular: Negative for chest pain, palpitations and leg swelling.  Gastrointestinal: Negative for abdominal pain, diarrhea, constipation and abdominal distention.  Genitourinary: Negative for urgency, frequency, decreased urine volume and difficulty urinating.  Musculoskeletal: Negative for back pain, arthralgias and gait problem.  Skin: Negative for color change, pallor and rash.  Neurological: Negative for dizziness, light-headedness, numbness and headaches.  Hematological: Negative for adenopathy. Does not bruise/bleed easily.  Psychiatric/Behavioral: Negative for suicidal ideas, confusion, sleep disturbance, self-injury, dysphoric mood, decreased concentration and agitation.       Objective:    BP 122/84  Pulse 64  Temp(Src) 98.1 F (36.7 C) (Oral)  Resp 16  Ht 5\' 7"  (1.702 m)  Wt 200 lb 2 oz (90.776 kg)  BMI 31.34 kg/m2  SpO2 98%  LMP 04/29/2012 General appearance: alert, cooperative, appears stated age and no distress Head: Normocephalic, without obvious abnormality, atraumatic Eyes: conjunctivae/corneas clear. PERRL, EOM's intact. Fundi benign. Ears: normal TM's and external ear canals both ears Nose: Nares normal. Septum midline. Mucosa normal. No drainage or sinus tenderness. Throat: lips, mucosa, and tongue normal; teeth and gums normal Neck: no adenopathy, no carotid bruit, no JVD, supple, symmetrical, trachea midline and thyroid not enlarged, symmetric, no tenderness/mass/nodules  Back: symmetric, no curvature. ROM normal. No CVA tenderness. Lungs: clear to auscultation bilaterally Breasts: gyn Heart: regular rate and rhythm, S1, S2 normal, no murmur, click, rub or gallop Abdomen: soft,  non-tender; bowel sounds normal; no masses,  no organomegaly Pelvic: deferred--gyn Extremities: extremities normal, atraumatic, no cyanosis or edema Pulses: 2+ and symmetric Skin: Skin color, texture, turgor normal. No rashes or lesions Lymph nodes: Cervical, supraclavicular, and axillary nodes normal. Neurologic: Alert and oriented X 3, normal strength and tone. Normal symmetric reflexes. Normal coordination and gait Psych--no anxiety, no depression      Assessment:    Healthy female exam.      Plan:     ghm utd--- colon ordered Pap and mammo per gyn Check labs See After Visit Summary for Counseling Recommendations

## 2012-06-07 ENCOUNTER — Other Ambulatory Visit: Payer: Self-pay

## 2012-06-07 ENCOUNTER — Encounter: Payer: Self-pay | Admitting: Family Medicine

## 2012-06-07 ENCOUNTER — Ambulatory Visit (INDEPENDENT_AMBULATORY_CARE_PROVIDER_SITE_OTHER): Payer: Managed Care, Other (non HMO) | Admitting: Family Medicine

## 2012-06-07 VITALS — BP 118/80 | HR 75 | Temp 98.1°F | Wt 201.0 lb

## 2012-06-07 DIAGNOSIS — Z1231 Encounter for screening mammogram for malignant neoplasm of breast: Secondary | ICD-10-CM

## 2012-06-07 DIAGNOSIS — S139XXA Sprain of joints and ligaments of unspecified parts of neck, initial encounter: Secondary | ICD-10-CM

## 2012-06-07 DIAGNOSIS — S161XXA Strain of muscle, fascia and tendon at neck level, initial encounter: Secondary | ICD-10-CM

## 2012-06-07 MED ORDER — CYCLOBENZAPRINE HCL 10 MG PO TABS: 10.0000 mg | ORAL_TABLET | Freq: Three times a day (TID) | ORAL | Status: DC | PRN

## 2012-06-07 MED ORDER — TRAMADOL HCL 50 MG PO TABS: 50.0000 mg | ORAL_TABLET | Freq: Three times a day (TID) | ORAL | Status: DC | PRN

## 2012-06-07 NOTE — Patient Instructions (Signed)
Shoulder Pain The shoulder is the joint that connects your arms to your body. The bones that form the shoulder joint include the upper arm bone (humerus), the shoulder blade (scapula), and the collarbone (clavicle). The top of the humerus is shaped like a ball and fits into a rather flat socket on the scapula (glenoid cavity). A combination of muscles and strong, fibrous tissues that connect muscles to bones (tendons) support your shoulder joint and hold the ball in the socket. Small, fluid-filled sacs (bursae) are located in different areas of the joint. They act as cushions between the bones and the overlying soft tissues and help reduce friction between the gliding tendons and the bone as you move your arm. Your shoulder joint allows a wide range of motion in your arm. This range of motion allows you to do things like scratch your back or throw a ball. However, this range of motion also makes your shoulder more prone to pain from overuse and injury. Causes of shoulder pain can originate from both injury and overuse and usually can be grouped in the following four categories:  Redness, swelling, and pain (inflammation) of the tendon (tendinitis) or the bursae (bursitis).  Instability, such as a dislocation of the joint.  Inflammation of the joint (arthritis).  Broken bone (fracture). HOME CARE INSTRUCTIONS   Apply ice to the sore area.  Put ice in a plastic bag.  Place a towel between your skin and the bag.  Leave the ice on for 15 to 20 minutes, 3 to 4 times per day for the first 2 days.  If you have a shoulder sling or immobilizer, wear it as long as your caregiver instructs. Only remove it to shower or bathe. Move your arm as little as possible, but keep your hand moving to prevent swelling.  Only take over-the-counter or prescription medicines for pain, discomfort, or fever as directed by your caregiver. SEEK MEDICAL CARE IF:   Your shoulder pain increases, or new pain develops in  your arm, hand, or fingers.  Your hand or fingers become cold and numb.  Your pain is not relieved with medicines. SEEK IMMEDIATE MEDICAL CARE IF:   Your arm, hand, or fingers are numb or tingling.  Your arm, hand, or fingers are significantly swollen or turn white or blue. MAKE SURE YOU:   Understand these instructions.  Will watch your condition.  Will get help right away if you are not doing well or get worse. Document Released: 10/22/2004 Document Revised: 04/06/2011 Document Reviewed: 12/27/2010 ExitCare Patient Information 2013 [employer].  

## 2012-06-07 NOTE — Progress Notes (Signed)
  Subjective:    Gwendolyn Weaver is a 52 y.o. female who presents with right shoulder pain. The symptoms began several months ago. Aggravating factors: no known event. Pain is located diffusely throughout the shoulder. Discomfort is described as aching, burning and tingling. Symptoms are exacerbated by repetitive movements and overhead movements. Evaluation to date: none. Therapy to date includes: nothing specific.  The following portions of the patient's history were reviewed and updated as appropriate: allergies, current medications, past family history, past medical history, past social history, past surgical history and problem list.  Review of Systems Pertinent items are noted in HPI.   Objective:    BP 118/80  Pulse 75  Temp(Src) 98.1 F (36.7 C) (Oral)  Wt 201 lb (91.173 kg)  BMI 31.47 kg/m2  SpO2 97%  LMP 04/29/2012 Right shoulder: full ROM and + tenderness with palpation of trap on right  Left shoulder: normal active ROM, no tenderness, no impingement sign     Assessment:    Right shoulder pain ---cervical strain    Plan:    Agricultural engineer distributed. Reduction in offending activity. Gentle ROM exercises. Rest, ice, compression, and elevation (RICE) therapy. NSAIDs per medication orders. sling--  ortho if no better

## 2012-07-14 ENCOUNTER — Ambulatory Visit
Admission: RE | Admit: 2012-07-14 | Discharge: 2012-07-14 | Disposition: A | Discharge status: Home / Self Care | Payer: Managed Care, Other (non HMO) | Source: Ambulatory Visit

## 2012-07-14 DIAGNOSIS — Z1231 Encounter for screening mammogram for malignant neoplasm of breast: Secondary | ICD-10-CM

## 2012-09-15 ENCOUNTER — Encounter (HOSPITAL_COMMUNITY): Payer: Self-pay

## 2012-09-15 ENCOUNTER — Emergency Department (HOSPITAL_COMMUNITY): Payer: Managed Care, Other (non HMO)

## 2012-09-15 ENCOUNTER — Emergency Department (HOSPITAL_COMMUNITY)
Admission: EM | Admit: 2012-09-15 | Discharge: 2012-09-15 | Disposition: A | Discharge status: Home / Self Care | Payer: Managed Care, Other (non HMO) | Attending: Emergency Medicine | Admitting: Emergency Medicine

## 2012-09-15 DIAGNOSIS — Z79899 Other long term (current) drug therapy: Secondary | ICD-10-CM | POA: Insufficient documentation

## 2012-09-15 DIAGNOSIS — K219 Gastro-esophageal reflux disease without esophagitis: Secondary | ICD-10-CM | POA: Insufficient documentation

## 2012-09-15 DIAGNOSIS — Y9389 Activity, other specified: Secondary | ICD-10-CM | POA: Insufficient documentation

## 2012-09-15 DIAGNOSIS — S0990XA Unspecified injury of head, initial encounter: Secondary | ICD-10-CM | POA: Insufficient documentation

## 2012-09-15 DIAGNOSIS — Y92410 Unspecified street and highway as the place of occurrence of the external cause: Secondary | ICD-10-CM | POA: Insufficient documentation

## 2012-09-15 DIAGNOSIS — S0993XA Unspecified injury of face, initial encounter: Secondary | ICD-10-CM | POA: Insufficient documentation

## 2012-09-15 MED ORDER — HYDROCODONE-ACETAMINOPHEN 5-325 MG PO TABS: 1.0000 | ORAL_TABLET | Freq: Four times a day (QID) | ORAL | Status: DC | PRN

## 2012-09-15 MED ORDER — DIAZEPAM 5 MG PO TABS
5.0000 mg | ORAL_TABLET | Freq: Once | ORAL | Status: AC
  Administered 2012-09-15: 5 mg via ORAL
  Filled 2012-09-15: qty 1

## 2012-09-15 MED ORDER — ACETAMINOPHEN 500 MG PO TABS
1000.0000 mg | ORAL_TABLET | Freq: Once | ORAL | Status: AC
  Administered 2012-09-15: 1000 mg via ORAL
  Filled 2012-09-15: qty 2

## 2012-09-15 MED ORDER — HYDROCODONE-ACETAMINOPHEN 5-325 MG PO TABS
2.0000 | ORAL_TABLET | Freq: Once | ORAL | Status: AC
  Administered 2012-09-15: 2 via ORAL
  Filled 2012-09-15: qty 2

## 2012-09-15 NOTE — ED Notes (Signed)
MD made aware of pelvis xray results. Gives okay for pt to ambulate to the bathroom.
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
## 2012-09-23 ENCOUNTER — Encounter: Payer: Self-pay | Admitting: Family Medicine

## 2012-09-23 ENCOUNTER — Ambulatory Visit (INDEPENDENT_AMBULATORY_CARE_PROVIDER_SITE_OTHER): Payer: Managed Care, Other (non HMO) | Admitting: Family Medicine

## 2012-09-23 VITALS — BP 122/70 | HR 75 | Temp 98.1°F | Wt 191.6 lb

## 2012-09-23 DIAGNOSIS — E669 Obesity, unspecified: Secondary | ICD-10-CM | POA: Insufficient documentation

## 2012-09-23 DIAGNOSIS — S139XXA Sprain of joints and ligaments of unspecified parts of neck, initial encounter: Secondary | ICD-10-CM

## 2012-09-23 DIAGNOSIS — S134XXA Sprain of ligaments of cervical spine, initial encounter: Secondary | ICD-10-CM

## 2012-09-23 MED ORDER — CYCLOBENZAPRINE HCL 10 MG PO TABS: 10.0000 mg | ORAL_TABLET | Freq: Three times a day (TID) | ORAL | Status: DC | PRN
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
## 2012-09-24 ENCOUNTER — Encounter: Payer: Self-pay | Admitting: Family Medicine

## 2012-09-24 DIAGNOSIS — S134XXA Sprain of ligaments of cervical spine, initial encounter: Secondary | ICD-10-CM | POA: Insufficient documentation

## 2012-09-24 NOTE — Assessment & Plan Note (Addendum)
Muscle relaxer And pain med See AVS Warm compresses rto prn
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
## 2013-06-13 ENCOUNTER — Telehealth: Payer: Self-pay | Admitting: Family Medicine

## 2013-06-13 ENCOUNTER — Other Ambulatory Visit: Payer: Self-pay

## 2013-06-13 MED ORDER — OMEPRAZOLE 20 MG PO CPDR
20.0000 mg | DELAYED_RELEASE_CAPSULE | Freq: Every day | ORAL | Status: DC
Start: 2013-06-13 — End: 2013-10-20

## 2013-06-13 NOTE — Telephone Encounter (Signed)
Caller name:Enisa Angeletti Relation to YC:[id number] Call back number: [phone] Pharmacy: [facility]  Reason for call: to inform us that she is faxing over a form to refill her omeprazole (PRILOSEC) 20 MG capsule

## 2013-06-13 NOTE — Telephone Encounter (Signed)
Rx faxed to [facility]

## 2013-06-13 NOTE — Telephone Encounter (Signed)
Duplicate request for Omeprazole.    KP

## 2013-06-14 ENCOUNTER — Other Ambulatory Visit: Payer: Self-pay

## 2013-06-14 DIAGNOSIS — Z1231 Encounter for screening mammogram for malignant neoplasm of breast: Secondary | ICD-10-CM

## 2013-06-28 ENCOUNTER — Encounter: Payer: Self-pay | Admitting: Women's Health

## 2013-06-28 ENCOUNTER — Ambulatory Visit (INDEPENDENT_AMBULATORY_CARE_PROVIDER_SITE_OTHER): Payer: BC Managed Care – PPO | Admitting: Women's Health

## 2013-06-28 VITALS — BP 124/84 | Ht 66.5 in | Wt 200.0 lb

## 2013-06-28 DIAGNOSIS — N938 Other specified abnormal uterine and vaginal bleeding: Secondary | ICD-10-CM

## 2013-06-28 DIAGNOSIS — N949 Unspecified condition associated with female genital organs and menstrual cycle: Secondary | ICD-10-CM

## 2013-06-28 DIAGNOSIS — N925 Other specified irregular menstruation: Secondary | ICD-10-CM

## 2013-06-28 DIAGNOSIS — Z01419 Encounter for gynecological examination (general) (routine) without abnormal findings: Secondary | ICD-10-CM

## 2013-06-28 DIAGNOSIS — Z13220 Encounter for screening for lipoid disorders: Secondary | ICD-10-CM

## 2013-06-28 MED ORDER — MEDROXYPROGESTERONE ACETATE 10 MG PO TABS: 10.0000 mg | ORAL_TABLET | Freq: Every day | ORAL | Status: DC

## 2013-06-28 NOTE — Patient Instructions (Signed)
Dysfunctional Uterine Bleeding Normally, menstrual periods begin between ages 11 to 17 in [place] women. A normal menstrual cycle/period may begin every 23 days up to 35 days and lasts from 1 to 7 days. Around 12 to 14 days before your menstrual period starts, ovulation (ovary produces an egg) occurs. When counting the time between menstrual periods, count from the first day of bleeding of the previous period to the first day of bleeding of the next period. Dysfunctional (abnormal) uterine bleeding is bleeding that is different from a normal menstrual period. Your periods may come earlier or later than usual. They may be lighter, have blood clots or be heavier. You may have bleeding between periods, or you may skip one period or more. You may have bleeding after sexual intercourse, bleeding after menopause, or no menstrual period. CAUSES   Pregnancy (normal, miscarriage, tubal).  IUDs (intrauterine device, birth control).  Birth control pills.  Hormone treatment.  Menopause.  Infection of the cervix.  Blood clotting problems.  Infection of the inside lining of the uterus.  Endometriosis, inside lining of the uterus growing in the pelvis and other female organs.  Adhesions (scar tissue) inside the uterus.  Obesity or severe weight loss.  Uterine polyps inside the uterus.  Cancer of the vagina, cervix, or uterus.  Ovarian cysts or polycystic ovary syndrome.  Medical problems (diabetes, thyroid disease).  Uterine fibroids (noncancerous tumor).  Problems with your female hormones.  Endometrial hyperplasia, very thick lining and enlarged cells inside of the uterus.  Medicines that interfere with ovulation.  Radiation to the pelvis or abdomen.  Chemotherapy. DIAGNOSIS   Your doctor will discuss the history of your menstrual periods, medicines you are taking, changes in your weight, stress in your life, and any medical problems you may have.  Your doctor will do a physical  and pelvic examination.  Your doctor may want to perform certain tests to make a diagnosis, such as:  Pap test.  Blood tests.  Cultures for infection.  CT scan.  Ultrasound.  Hysteroscopy.  Laparoscopy.  MRI.  Hysterosalpingography.  D and C.  Endometrial biopsy. TREATMENT  Treatment will depend on the cause of the dysfunctional uterine bleeding (DUB). Treatment may include:  Observing your menstrual periods for a couple of months.  Prescribing medicines for medical problems, including:  Antibiotics.  Hormones.  Birth control pills.  Removing an IUD (intrauterine device, birth control).  Surgery:  D and C (scrape and remove tissue from inside the uterus).  Laparoscopy (examine inside the abdomen with a lighted tube).  Uterine ablation (destroy lining of the uterus with electrical current, laser, heat, or freezing).  Hysteroscopy (examine cervix and uterus with a lighted tube).  Hysterectomy (remove the uterus). HOME CARE INSTRUCTIONS   If medicines were prescribed, take exactly as directed. Do not change or switch medicines without consulting your caregiver.  Long term heavy bleeding may result in iron deficiency. Your caregiver may have prescribed iron pills. They help replace the iron that your body lost from heavy bleeding. Take exactly as directed.  Do not take aspirin or medicines that contain aspirin one week before or during your menstrual period. Aspirin may make the bleeding worse.  If you need to change your sanitary pad or tampon more than once every 2 hours, stay in bed with your feet elevated and a cold pack on your lower abdomen. Rest as much as possible, until the bleeding stops or slows down.  Eat well-balanced meals. Eat foods high in iron. Examples   are:  Leafy green vegetables.  Whole-grain breads and cereals.  Eggs.  Meat.  Liver.  Do not try to lose weight until the abnormal bleeding has stopped and your blood iron level is  back to normal. Do not lift more than ten pounds or do strenuous activities when you are bleeding.  For a couple of months, make note on your calendar, marking the start and ending of your period, and the type of bleeding (light, medium, heavy, spotting, clots or missed periods). This is for your caregiver to better evaluate your problem. SEEK MEDICAL CARE IF:   You develop nausea (feeling sick to your stomach) and vomiting, dizziness, or diarrhea while you are taking your medicine.  You are getting lightheaded or weak.  You have any problems that may be related to the medicine you are taking.  You develop pain with your DUB.  You want to remove your IUD.  You want to stop or change your birth control pills or hormones.  You have any type of abnormal bleeding mentioned above.  You are over 16 years old and have not had a menstrual period yet.  You are 53 years old and you are still having menstrual periods.  You have any of the symptoms mentioned above.  You develop a rash. SEEK IMMEDIATE MEDICAL CARE IF:   An oral temperature above 102 F (38.9 C) develops.  You develop chills.  You are changing your sanitary pad or tampon more than once an hour.  You develop abdominal pain.  You pass out or faint. Document Released: 01/10/2000 Document Revised: 04/06/2011 Document Reviewed: 12/11/2008 ExitCare Patient Information 2014 [employer].  

## 2013-06-28 NOTE — Progress Notes (Signed)
Gwendolyn Weaver 01/04/1961 [id number]    History:    Presents for annual exam.  BTL. 2013 cycles spacing to every 2-3 months, last normal cycle November 2014. Has had continued spotting since end of  March. Normal mammograms.  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 2010 negative colonoscopy.  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 [place]ar 22 graduating from [employer] doing well. Father, brother and other family members with hypertension. Partial parathyroidectomy 2011.  ROS:  A  12 point ROS was performed and pertinent positives and negatives are included.  Exam:  Filed Vitals:   06/28/13 1415  BP: 124/84    General appearance:  Normal Thyroid:  Symmetrical, normal in size, without palpable masses or nodularity. Respiratory  Auscultation:  Clear without wheezing or rhonchi Cardiovascular  Auscultation:  Regular rate, without rubs, murmurs or gallops  Edema/varicosities:  Not grossly evident Abdominal  Soft,nontender, without masses, guarding or rebound.  Liver/spleen:  No organomegaly noted  Hernia:  None appreciated  Skin  Inspection:  Grossly normal   Breasts: Examined lying and sitting.     Right: Without masses, retractions, discharge or axillary adenopathy.     Left: Without masses, retractions, discharge or axillary adenopathy. Gentitourinary   Inguinal/mons:  Normal without inguinal adenopathy  External genitalia:  Normal  BUS/Urethra/Skene's glands:  Normal  Vagina:  Normal  Cervix:  Normal menses type blood noted  Uterus:   normal in size, shape and contour.  Midline and mobile  Adnexa/parametria:     Rt: Without masses or tenderness.   Lt: Without masses or tenderness.  Anus and perineum: Normal  Digital rectal exam: Normal sphincter tone without palpated masses or  tenderness  Assessment/Plan:  53 y.o. MBF G2P1 for annual exam.     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 Phineas Real. Provera 10mg  for 10 days. SBE's,  keep scheduled mammogram appointment, calcium rich diet, vitamin D 2000 daily encouraged. CBC, comprehensive metabolic panel, TSH, FSH, UA, Pap normal with negative HR HPV 2013, new screening guidelines reviewed.   Note: This dictation was prepared with Dragon/digital dictation.  Any transcriptional errors that result are unintentional. Huel Cote [facility], 4:35 PM 06/28/2013

## 2013-06-29 LAB — COMPREHENSIVE METABOLIC PANEL
ALBUMIN: 4.2 g/dL (ref 3.5–5.2)
ALK PHOS: 74 U/L (ref 39–117)
ALT: 15 U/L (ref 0–35)
AST: 15 U/L (ref 0–37)
BUN: 13 mg/dL (ref 6–23)
CALCIUM: 9.9 mg/dL (ref 8.4–10.5)
CHLORIDE: 103 meq/L (ref 96–112)
CO2: 26 mEq/L (ref 19–32)
Creat: 1.03 mg/dL (ref 0.50–1.10)
Glucose, Bld: 100 mg/dL — ABNORMAL HIGH (ref 70–99)
POTASSIUM: 3.9 meq/L (ref 3.5–5.3)
SODIUM: 139 meq/L (ref 135–145)
TOTAL PROTEIN: 7.1 g/dL (ref 6.0–8.3)
Total Bilirubin: 0.7 mg/dL (ref 0.2–1.2)

## 2013-06-29 LAB — CBC WITH DIFFERENTIAL/PLATELET
BASOS PCT: 0 % (ref 0–1)
Basophils Absolute: 0 10*3/uL (ref 0.0–0.1)
EOS ABS: 0.1 10*3/uL (ref 0.0–0.7)
Eosinophils Relative: 1 % (ref 0–5)
HEMATOCRIT: 38 % (ref 36.0–46.0)
HEMOGLOBIN: 13 g/dL (ref 12.0–15.0)
LYMPHS ABS: 2.6 10*3/uL (ref 0.7–4.0)
Lymphocytes Relative: 52 % — ABNORMAL HIGH (ref 12–46)
MCH: 29.7 pg (ref 26.0–34.0)
MCHC: 34.2 g/dL (ref 30.0–36.0)
MCV: 86.8 fL (ref 78.0–100.0)
MONO ABS: 0.3 10*3/uL (ref 0.1–1.0)
MONOS PCT: 6 % (ref 3–12)
NEUTROS PCT: 41 % — AB (ref 43–77)
Neutro Abs: 2.1 10*3/uL (ref 1.7–7.7)
Platelets: 272 10*3/uL (ref 150–400)
RBC: 4.38 MIL/uL (ref 3.87–5.11)
RDW: 14.7 % (ref 11.5–15.5)
WBC: 5 10*3/uL (ref 4.0–10.5)

## 2013-06-29 LAB — URINALYSIS W MICROSCOPIC + REFLEX CULTURE
BACTERIA UA: NONE SEEN
BILIRUBIN URINE: NEGATIVE
Casts: NONE SEEN
Crystals: NONE SEEN
Glucose, UA: NEGATIVE mg/dL
Hgb urine dipstick: NEGATIVE
KETONES UR: NEGATIVE mg/dL
Leukocytes, UA: NEGATIVE
NITRITE: NEGATIVE
PROTEIN: NEGATIVE mg/dL
SPECIFIC GRAVITY, URINE: 1.015 (ref 1.005–1.030)
Squamous Epithelial / LPF: NONE SEEN
UROBILINOGEN UA: 0.2 mg/dL (ref 0.0–1.0)
pH: 5.5 (ref 5.0–8.0)

## 2013-06-29 LAB — FOLLICLE STIMULATING HORMONE: FSH: 69.4 m[IU]/mL

## 2013-06-29 LAB — TSH: TSH: 1.605 u[IU]/mL (ref 0.350–4.500)

## 2013-06-29 LAB — LIPID PANEL
CHOL/HDL RATIO: 5.8 ratio
CHOLESTEROL: 251 mg/dL — AB (ref 0–200)
HDL: 43 mg/dL (ref >39)
LDL CALC: 177 mg/dL — AB (ref 0–99)
TRIGLYCERIDES: 154 mg/dL — AB (ref <150)
VLDL: 31 mg/dL (ref 0–40)

## 2013-07-13 ENCOUNTER — Telehealth: Payer: Self-pay | Admitting: *Deleted

## 2013-07-13 MED ORDER — MEGESTROL ACETATE 40 MG PO TABS
40.0000 mg | ORAL_TABLET | Freq: Two times a day (BID) | ORAL | Status: DC
Start: 2013-07-13 — End: 2013-08-14

## 2013-07-13 NOTE — Telephone Encounter (Signed)
Pt calling to follow up from [place] 06/28/13 took Provera 10 mg x 10 days stop bleeding for one day. Pt now bleeding heavy passing clots, going to bathroom every 15 minutes using tampon and pad and bad cramping. [place] scheduled 07/20/13. Pt asked what is the next step for the bleeding? Please advise

## 2013-07-13 NOTE — Telephone Encounter (Signed)
Please call, Megace 40 mg twice daily until sonohysterogram, please E. scribe Megace 40 twice a day #40 with no refills

## 2013-07-13 NOTE — Telephone Encounter (Signed)
Telephone call, states bleeding had slowed since starting Megace will continue until the 25th with sonohysterogram

## 2013-07-13 NOTE — Telephone Encounter (Signed)
Left on voicemail Rx has been sent. 

## 2013-07-18 ENCOUNTER — Ambulatory Visit
Admission: RE | Admit: 2013-07-18 | Discharge: 2013-07-18 | Disposition: A | Discharge status: Home / Self Care | Payer: BC Managed Care – PPO | Source: Ambulatory Visit

## 2013-07-18 DIAGNOSIS — Z1231 Encounter for screening mammogram for malignant neoplasm of breast: Secondary | ICD-10-CM

## 2013-07-19 ENCOUNTER — Other Ambulatory Visit: Payer: Self-pay | Admitting: Women's Health

## 2013-07-19 ENCOUNTER — Other Ambulatory Visit: Payer: Self-pay | Admitting: Gynecology

## 2013-07-19 DIAGNOSIS — N938 Other specified abnormal uterine and vaginal bleeding: Secondary | ICD-10-CM

## 2013-07-21 ENCOUNTER — Other Ambulatory Visit: Payer: BC Managed Care – PPO

## 2013-07-21 ENCOUNTER — Ambulatory Visit: Payer: BC Managed Care – PPO | Admitting: Gynecology

## 2013-07-21 ENCOUNTER — Encounter: Payer: Self-pay | Admitting: Gynecology

## 2013-07-21 ENCOUNTER — Ambulatory Visit (INDEPENDENT_AMBULATORY_CARE_PROVIDER_SITE_OTHER): Payer: BC Managed Care – PPO

## 2013-07-21 ENCOUNTER — Ambulatory Visit (INDEPENDENT_AMBULATORY_CARE_PROVIDER_SITE_OTHER): Payer: BC Managed Care – PPO | Admitting: Gynecology

## 2013-07-21 DIAGNOSIS — N938 Other specified abnormal uterine and vaginal bleeding: Secondary | ICD-10-CM

## 2013-07-21 DIAGNOSIS — N926 Irregular menstruation, unspecified: Secondary | ICD-10-CM

## 2013-07-21 DIAGNOSIS — N949 Unspecified condition associated with female genital organs and menstrual cycle: Secondary | ICD-10-CM

## 2013-07-21 NOTE — Patient Instructions (Signed)
Office will call you with biopsy results 

## 2013-07-21 NOTE — Progress Notes (Signed)
Gwendolyn Weaver 04-23-1960 [id number]        53 y.o.  G2P1001 presents for [place] for her annual and notes her menstrual cycle started spacing out in 2013 every 2-3 months. Her LMP was November 2014. She began spotting on and off since March 2015. Was treated with Provera 10 mg x10 days and subsequently started bleeding heavily and socially started on Megace which has stopped her bleeding. Patient is status post BTL. Recent lab work showed a normal TSH and an [place] of 69.  Past medical history,surgical history, problem list, medications, allergies, family history and social history were all reviewed and documented in the EPIC chart.  Directed ROS with pertinent positives and negatives documented in the history of present illness/assessment and plan.  Ultrasound shows uterus overall normal in size. 3 small myomas noted largest measuring 23 mm. Endometrial echo 16.8 mm. Ovaries are difficult to visualize but no adnexal pathology noted. Cul-de-sac negative.  Sonohysterogram performed, sterile technique, easy catheter introduction, good distention with a thickened posterior wall endometrium which was overlying the catheter and felt probably secondary to fluid injection. No polyps or submucous myomas noted. Endometrial sample taken through this area with good return of tissue.  Assessment/Plan:  53 y.o. G2P1001 with perimenopausal irregular bleeding. Recommend stop Megace now. Followup for biopsy results. Assuming negative then we'll plan expectant management and she will call if she has prolonged or atypical bleeding.   Note: This document was prepared with digital dictation and possible smart phrase technology. Any transcriptional errors that result from this process are unintentional.   Anastasio Auerbach MD, 4:40 PM 07/21/2013

## 2013-08-04 ENCOUNTER — Telehealth: Payer: Self-pay

## 2013-08-04 ENCOUNTER — Other Ambulatory Visit: Payer: Self-pay | Admitting: Gynecology

## 2013-08-04 MED ORDER — MEDROXYPROGESTERONE ACETATE 10 MG PO TABS
10.0000 mg | ORAL_TABLET | Freq: Two times a day (BID) | ORAL | Status: DC
Start: 2013-08-04 — End: 2013-09-15

## 2013-08-04 NOTE — Telephone Encounter (Signed)
Recommend Provera 10 mg twice a day x5 days. We will see if this does not end her bleeding.

## 2013-08-04 NOTE — Telephone Encounter (Signed)
Patient said had she taken Provera 06/28/13 and it caused her to bleed.    07/21/13  "53 y.o. G2P1001 presents for [place] for her annual and notes her menstrual cycle started spacing out in 2013 every 2-3 months. Her LMP was November 2014. She began spotting on and off since March 2015. Was treated with Provera 10 mg x10 days and subsequently started bleeding heavily and socially started on Megace which has stopped her bleeding. Patient is status post BTL. Recent lab work showed a normal TSH and an [place] of 69."

## 2013-08-04 NOTE — Telephone Encounter (Signed)
It should hopefully shed what is there and then stop the bleeding.

## 2013-08-04 NOTE — Telephone Encounter (Signed)
Patient was in to see you 07/21/13.  She calls today to report spotting x 3 days and today has increased to a flow.

## 2013-08-04 NOTE — Telephone Encounter (Signed)
Patient informed. Rx sent 

## 2013-08-11 ENCOUNTER — Telehealth: Payer: Self-pay | Admitting: *Deleted

## 2013-08-11 NOTE — Telephone Encounter (Signed)
Pt took all provera 10 mg daily x 5 days as directed on telephone encounter 08/04/13 and bleeding never stopped. Pt said not heavy, normal cycle flow, no pain. Pt said she took megace last time for bleeding and asked if you would prescribe this. I did explain to patient you were out of the office and will return on Monday. Pt was fine waiting until she heard from you, since bleeding was not heavy. Please advise

## 2013-08-13 NOTE — Telephone Encounter (Signed)
Megace 20 mg Bid x 10 days.  OV if irreg bleeding continues.

## 2013-08-14 MED ORDER — MEGESTROL ACETATE 20 MG PO TABS: 20.0000 mg | ORAL_TABLET | Freq: Every day | ORAL | Status: DC

## 2013-08-14 NOTE — Telephone Encounter (Signed)
Pt informed, rx sent 

## 2013-09-12 ENCOUNTER — Telehealth: Payer: Self-pay | Admitting: *Deleted

## 2013-09-12 NOTE — Telephone Encounter (Signed)
Pt called c/o vaginally bleeding again from telephone encounter 08/11/13. Per note pt will need OV

## 2013-09-14 ENCOUNTER — Ambulatory Visit (INDEPENDENT_AMBULATORY_CARE_PROVIDER_SITE_OTHER): Payer: BC Managed Care – PPO | Admitting: Medical

## 2013-09-14 ENCOUNTER — Encounter: Payer: Self-pay | Admitting: Medical

## 2013-09-14 VITALS — BP 127/84 | HR 79 | Temp 98.3°F | Ht 67.5 in | Wt 194.0 lb

## 2013-09-14 DIAGNOSIS — E559 Vitamin D deficiency, unspecified: Secondary | ICD-10-CM

## 2013-09-14 DIAGNOSIS — Z0000 Encounter for general adult medical examination without abnormal findings: Secondary | ICD-10-CM | POA: Insufficient documentation

## 2013-09-14 LAB — CBC WITH DIFFERENTIAL/PLATELET
BASOS ABS: 0 10*3/uL (ref 0.0–0.1)
BASOS PCT: 0.5 % (ref 0.0–3.0)
EOS ABS: 0.1 10*3/uL (ref 0.0–0.7)
Eosinophils Relative: 1 % (ref 0.0–5.0)
HCT: 37.9 % (ref 36.0–46.0)
Hemoglobin: 12.6 g/dL (ref 12.0–15.0)
Lymphocytes Relative: 48.1 % — ABNORMAL HIGH (ref 12.0–46.0)
Lymphs Abs: 2.5 10*3/uL (ref 0.7–4.0)
MCHC: 33.3 g/dL (ref 30.0–36.0)
MCV: 92.3 fl (ref 78.0–100.0)
MONO ABS: 0.4 10*3/uL (ref 0.1–1.0)
Monocytes Relative: 6.9 % (ref 3.0–12.0)
NEUTROS PCT: 43.5 % (ref 43.0–77.0)
Neutro Abs: 2.2 10*3/uL (ref 1.4–7.7)
PLATELETS: 272 10*3/uL (ref 150.0–400.0)
RBC: 4.11 Mil/uL (ref 3.87–5.11)
RDW: 15.1 % (ref 11.5–15.5)
WBC: 5.2 10*3/uL (ref 4.0–10.5)

## 2013-09-14 LAB — TSH: TSH: 0.91 u[IU]/mL (ref 0.35–4.50)

## 2013-09-14 LAB — COMPLETE METABOLIC PANEL WITH GFR
ALBUMIN: 4.2 g/dL (ref 3.5–5.2)
ALK PHOS: 63 U/L (ref 39–117)
ALT: 13 U/L (ref 0–35)
AST: 14 U/L (ref 0–37)
BUN: 10 mg/dL (ref 6–23)
CO2: 24 mEq/L (ref 19–32)
Calcium: 10 mg/dL (ref 8.4–10.5)
Chloride: 106 mEq/L (ref 96–112)
Creat: 1.04 mg/dL (ref 0.50–1.10)
GFR, Est African American: 70 mL/min
GFR, Est Non African American: 61 mL/min
Glucose, Bld: 73 mg/dL (ref 70–99)
POTASSIUM: 4.4 meq/L (ref 3.5–5.3)
SODIUM: 140 meq/L (ref 135–145)
Total Bilirubin: 1 mg/dL (ref 0.2–1.2)
Total Protein: 7.2 g/dL (ref 6.0–8.3)

## 2013-09-14 NOTE — Patient Instructions (Signed)
Preventive Care for Adults A healthy lifestyle and preventive care can promote health and wellness. Preventive health guidelines for women include the following key practices.  A routine yearly physical is a good way to check with your health care provider about your health and preventive screening. It is a chance to share any concerns and updates on your health and to receive a thorough exam.  Visit your dentist for a routine exam and preventive care every 6 months. Brush your teeth twice a day and floss once a day. Good oral hygiene prevents tooth decay and gum disease.  The frequency of eye exams is based on your age, health, family medical history, use of contact lenses, and other factors. Follow your health care provider's recommendations for frequency of eye exams.  Eat a healthy diet. Foods like vegetables, fruits, whole grains, low-fat dairy products, and lean protein foods contain the nutrients you need without too many calories. Decrease your intake of foods high in solid fats, added sugars, and salt. Eat the right amount of calories for you.Get information about a proper diet from your health care provider, if necessary.  Regular physical exercise is one of the most important things you can do for your health. Most adults should get at least 150 minutes of moderate-intensity exercise (any activity that increases your heart rate and causes you to sweat) each week. In addition, most adults need muscle-strengthening exercises on 2 or more days a week.  Maintain a healthy weight. The body mass index (BMI) is a screening tool to identify possible weight problems. It provides an estimate of body fat based on height and weight. Your health care provider can find your BMI and can help you achieve or maintain a healthy weight.For adults 20 years and older:  A BMI below 18.5 is considered underweight.  A BMI of 18.5 to 24.9 is normal.  A BMI of 25 to 29.9 is considered overweight.  A BMI of  30 and above is considered obese.  Maintain normal blood lipids and cholesterol levels by exercising and minimizing your intake of saturated fat. Eat a balanced diet with plenty of fruit and vegetables. Blood tests for lipids and cholesterol should begin at age 76 and be repeated every 5 years. If your lipid or cholesterol levels are high, you are over 50, or you are at high risk for heart disease, you may need your cholesterol levels checked more frequently.Ongoing high lipid and cholesterol levels should be treated with medicines if diet and exercise are not working.  If you smoke, find out from your health care provider how to quit. If you do not use tobacco, do not start.  Lung cancer screening is recommended for adults aged 22-80 years who are at high risk for developing lung cancer because of a history of smoking. A yearly low-dose CT scan of the lungs is recommended for people who have at least a 30-pack-year history of smoking and are a current smoker or have quit within the past 15 years. A pack year of smoking is smoking an average of 1 pack of cigarettes a day for 1 year (for example: 1 pack a day for 30 years or 2 packs a day for 15 years). Yearly screening should continue until the smoker has stopped smoking for at least 15 years. Yearly screening should be stopped for people who develop a health problem that would prevent them from having lung cancer treatment.  If you are pregnant, do not drink alcohol. If you are breastfeeding,  be very cautious about drinking alcohol. If you are not pregnant and choose to drink alcohol, do not have more than 1 drink per day. One drink is considered to be 12 ounces (355 mL) of beer, 5 ounces (148 mL) of wine, or 1.5 ounces (44 mL) of liquor.  Avoid use of street drugs. Do not share needles with anyone. Ask for help if you need support or instructions about stopping the use of drugs.  High blood pressure causes heart disease and increases the risk of  stroke. Your blood pressure should be checked at least every 1 to 2 years. Ongoing high blood pressure should be treated with medicines if weight loss and exercise do not work.  If you are 3-86 years old, ask your health care provider if you should take aspirin to prevent strokes.  Diabetes screening involves taking a blood sample to check your fasting blood sugar level. This should be done once every 3 years, after age 67, if you are within normal weight and without risk factors for diabetes. Testing should be considered at a younger age or be carried out more frequently if you are overweight and have at least 1 risk factor for diabetes.  Breast cancer screening is essential preventive care for women. You should practice "breast self-awareness." This means understanding the normal appearance and feel of your breasts and may include breast self-examination. Any changes detected, no matter how small, should be reported to a health care provider. Women in their 8s and 30s should have a clinical breast exam (CBE) by a health care provider as part of a regular health exam every 1 to 3 years. After age 70, women should have a CBE every year. Starting at age 25, women should consider having a mammogram (breast X-ray test) every year. Women who have a family history of breast cancer should talk to their health care provider about genetic screening. Women at a high risk of breast cancer should talk to their health care providers about having an MRI and a mammogram every year.  Breast cancer gene (BRCA)-related cancer risk assessment is recommended for women who have family members with BRCA-related cancers. BRCA-related cancers include breast, ovarian, tubal, and peritoneal cancers. Having family members with these cancers may be associated with an increased risk for harmful changes (mutations) in the breast cancer genes BRCA1 and BRCA2. Results of the assessment will determine the need for genetic counseling and  BRCA1 and BRCA2 testing.  Routine pelvic exams to screen for cancer are no longer recommended for nonpregnant women who are considered low risk for cancer of the pelvic organs (ovaries, uterus, and vagina) and who do not have symptoms. Ask your health care provider if a screening pelvic exam is right for you.  If you have had past treatment for cervical cancer or a condition that could lead to cancer, you need Pap tests and screening for cancer for at least 20 years after your treatment. If Pap tests have been discontinued, your risk factors (such as having a new sexual partner) need to be reassessed to determine if screening should be resumed. Some women have medical problems that increase the chance of getting cervical cancer. In these cases, your health care provider may recommend more frequent screening and Pap tests.  The HPV test is an additional test that may be used for cervical cancer screening. The HPV test looks for the virus that can cause the cell changes on the cervix. The cells collected during the Pap test can be  tested for HPV. The HPV test could be used to screen women aged 30 years and older, and should be used in women of any age who have unclear Pap test results. After the age of 30, women should have HPV testing at the same frequency as a Pap test.  Colorectal cancer can be detected and often prevented. Most routine colorectal cancer screening begins at the age of 50 years and continues through age 75 years. However, your health care provider may recommend screening at an earlier age if you have risk factors for colon cancer. On a yearly basis, your health care provider may provide home test kits to check for hidden blood in the stool. Use of a small camera at the end of a tube, to directly examine the colon (sigmoidoscopy or colonoscopy), can detect the earliest forms of colorectal cancer. Talk to your health care provider about this at age 50, when routine screening begins. Direct  exam of the colon should be repeated every 5-10 years through age 75 years, unless early forms of pre-cancerous polyps or small growths are found.  People who are at an increased risk for hepatitis B should be screened for this virus. You are considered at high risk for hepatitis B if:  You were born in a country where hepatitis B occurs often. Talk with your health care provider about which countries are considered high risk.  Your parents were born in a high-risk country and you have not received a shot to protect against hepatitis B (hepatitis B vaccine).  You have HIV or AIDS.  You use needles to inject street drugs.  You live with, or have sex with, someone who has hepatitis B.  You get hemodialysis treatment.  You take certain medicines for conditions like cancer, organ transplantation, and autoimmune conditions.  Hepatitis C blood testing is recommended for all people born from 1945 through 1965 and any individual with known risks for hepatitis C.  Practice safe sex. Use condoms and avoid high-risk sexual practices to reduce the spread of sexually transmitted infections (STIs). STIs include gonorrhea, chlamydia, syphilis, trichomonas, herpes, HPV, and human immunodeficiency virus (HIV). Herpes, HIV, and HPV are viral illnesses that have no cure. They can result in disability, cancer, and death.  You should be screened for sexually transmitted illnesses (STIs) including gonorrhea and chlamydia if:  You are sexually active and are younger than 24 years.  You are older than 24 years and your health care provider tells you that you are at risk for this type of infection.  Your sexual activity has changed since you were last screened and you are at an increased risk for chlamydia or gonorrhea. Ask your health care provider if you are at risk.  If you are at risk of being infected with HIV, it is recommended that you take a prescription medicine daily to prevent HIV infection. This is  called preexposure prophylaxis (PrEP). You are considered at risk if:  You are a heterosexual woman, are sexually active, and are at increased risk for HIV infection.  You take drugs by injection.  You are sexually active with a partner who has HIV.  Talk with your health care provider about whether you are at high risk of being infected with HIV. If you choose to begin PrEP, you should first be tested for HIV. You should then be tested every 3 months for as long as you are taking PrEP.  Osteoporosis is a disease in which the bones lose minerals and strength   with aging. This can result in serious bone fractures or breaks. The risk of osteoporosis can be identified using a bone density scan. Women ages 65 years and over and women at risk for fractures or osteoporosis should discuss screening with their health care providers. Ask your health care provider whether you should take a calcium supplement or vitamin D to reduce the rate of osteoporosis.  Menopause can be associated with physical symptoms and risks. Hormone replacement therapy is available to decrease symptoms and risks. You should talk to your health care provider about whether hormone replacement therapy is right for you.  Use sunscreen. Apply sunscreen liberally and repeatedly throughout the day. You should seek shade when your shadow is shorter than you. Protect yourself by wearing long sleeves, pants, a wide-brimmed hat, and sunglasses year round, whenever you are outdoors.  Once a month, do a whole body skin exam, using a mirror to look at the skin on your back. Tell your health care provider of new moles, moles that have irregular borders, moles that are larger than a pencil eraser, or moles that have changed in shape or color.  Stay current with required vaccines (immunizations).  Influenza vaccine. All adults should be immunized every year.  Tetanus, diphtheria, and acellular pertussis (Td, Tdap) vaccine. Pregnant women should  receive 1 dose of Tdap vaccine during each pregnancy. The dose should be obtained regardless of the length of time since the last dose. Immunization is preferred during the 27th-36th week of gestation. An adult who has not previously received Tdap or who does not know her vaccine status should receive 1 dose of Tdap. This initial dose should be followed by tetanus and diphtheria toxoids (Td) booster doses every 10 years. Adults with an unknown or incomplete history of completing a 3-dose immunization series with Td-containing vaccines should begin or complete a primary immunization series including a Tdap dose. Adults should receive a Td booster every 10 years.  Varicella vaccine. An adult without evidence of immunity to varicella should receive 2 doses or a second dose if she has previously received 1 dose. Pregnant females who do not have evidence of immunity should receive the first dose after pregnancy. This first dose should be obtained before leaving the health care facility. The second dose should be obtained 4-8 weeks after the first dose.  Human papillomavirus (HPV) vaccine. Females aged 13-26 years who have not received the vaccine previously should obtain the 3-dose series. The vaccine is not recommended for use in pregnant females. However, pregnancy testing is not needed before receiving a dose. If a female is found to be pregnant after receiving a dose, no treatment is needed. In that case, the remaining doses should be delayed until after the pregnancy. Immunization is recommended for any person with an immunocompromised condition through the age of 26 years if she did not get any or all doses earlier. During the 3-dose series, the second dose should be obtained 4-8 weeks after the first dose. The third dose should be obtained 24 weeks after the first dose and 16 weeks after the second dose.  Zoster vaccine. One dose is recommended for adults aged 60 years or older unless certain conditions are  present.  Measles, mumps, and rubella (MMR) vaccine. Adults born before 1957 generally are considered immune to measles and mumps. Adults born in 1957 or later should have 1 or more doses of MMR vaccine unless there is a contraindication to the vaccine or there is laboratory evidence of immunity to   each of the three diseases. A routine second dose of MMR vaccine should be obtained at least 28 days after the first dose for students attending postsecondary schools, health care workers, or international travelers. People who received inactivated measles vaccine or an unknown type of measles vaccine during 1963-1967 should receive 2 doses of MMR vaccine. People who received inactivated mumps vaccine or an unknown type of mumps vaccine before 1979 and are at high risk for mumps infection should consider immunization with 2 doses of MMR vaccine. For females of childbearing age, rubella immunity should be determined. If there is no evidence of immunity, females who are not pregnant should be vaccinated. If there is no evidence of immunity, females who are pregnant should delay immunization until after pregnancy. Unvaccinated health care workers born before 1957 who lack laboratory evidence of measles, mumps, or rubella immunity or laboratory confirmation of disease should consider measles and mumps immunization with 2 doses of MMR vaccine or rubella immunization with 1 dose of MMR vaccine.  Pneumococcal 13-valent conjugate (PCV13) vaccine. When indicated, a person who is uncertain of her immunization history and has no record of immunization should receive the PCV13 vaccine. An adult aged 19 years or older who has certain medical conditions and has not been previously immunized should receive 1 dose of PCV13 vaccine. This PCV13 should be followed with a dose of pneumococcal polysaccharide (PPSV23) vaccine. The PPSV23 vaccine dose should be obtained at least 8 weeks after the dose of PCV13 vaccine. An adult aged 19  years or older who has certain medical conditions and previously received 1 or more doses of PPSV23 vaccine should receive 1 dose of PCV13. The PCV13 vaccine dose should be obtained 1 or more years after the last PPSV23 vaccine dose.  Pneumococcal polysaccharide (PPSV23) vaccine. When PCV13 is also indicated, PCV13 should be obtained first. All adults aged 65 years and older should be immunized. An adult younger than age 65 years who has certain medical conditions should be immunized. Any person who resides in a nursing home or long-term care facility should be immunized. An adult smoker should be immunized. People with an immunocompromised condition and certain other conditions should receive both PCV13 and PPSV23 vaccines. People with human immunodeficiency virus (HIV) infection should be immunized as soon as possible after diagnosis. Immunization during chemotherapy or radiation therapy should be avoided. Routine use of PPSV23 vaccine is not recommended for American Indians, [place] Natives, or people younger than 65 years unless there are medical conditions that require PPSV23 vaccine. When indicated, people who have unknown immunization and have no record of immunization should receive PPSV23 vaccine. One-time revaccination 5 years after the first dose of PPSV23 is recommended for people aged 19-64 years who have chronic kidney failure, nephrotic syndrome, asplenia, or immunocompromised conditions. People who received 1-2 doses of PPSV23 before age 65 years should receive another dose of PPSV23 vaccine at age 65 years or later if at least 5 years have passed since the previous dose. Doses of PPSV23 are not needed for people immunized with PPSV23 at or after age 65 years.  Meningococcal vaccine. Adults with asplenia or persistent complement component deficiencies should receive 2 doses of quadrivalent meningococcal conjugate (MenACWY-D) vaccine. The doses should be obtained at least 2 months apart.  Microbiologists working with certain meningococcal bacteria, military recruits, people at risk during an outbreak, and people who travel to or live in countries with a high rate of meningitis should be immunized. A first-year college student up through age   21 years who is living in a residence hall should receive a dose if she did not receive a dose on or after her 16th birthday. Adults who have certain high-risk conditions should receive one or more doses of vaccine.  Hepatitis A vaccine. Adults who wish to be protected from this disease, have certain high-risk conditions, work with hepatitis A-infected animals, work in hepatitis A research labs, or travel to or work in countries with a high rate of hepatitis A should be immunized. Adults who were previously unvaccinated and who anticipate close contact with an international adoptee during the first 60 days after arrival in the [place] from a country with a high rate of hepatitis A should be immunized.  Hepatitis B vaccine. Adults who wish to be protected from this disease, have certain high-risk conditions, may be exposed to blood or other infectious body fluids, are household contacts or sex partners of hepatitis B positive people, are clients or workers in certain care facilities, or travel to or work in countries with a high rate of hepatitis B should be immunized.  Haemophilus influenzae type b (Hib) vaccine. A previously unvaccinated person with asplenia or sickle cell disease or having a scheduled splenectomy should receive 1 dose of Hib vaccine. Regardless of previous immunization, a recipient of a hematopoietic stem cell transplant should receive a 3-dose series 6-12 months after her successful transplant. Hib vaccine is not recommended for adults with HIV infection. Preventive Services / Frequency Ages 64 to 68 years  Blood pressure check.** / Every 1 to 2 years.  Lipid and cholesterol check.** / Every 5 years beginning at age  22.  Clinical breast exam.** / Every 3 years for women in their 88s and 53s.  BRCA-related cancer risk assessment.** / For women who have family members with a BRCA-related cancer (breast, ovarian, tubal, or peritoneal cancers).  Pap test.** / Every 2 years from ages 90 through 51. Every 3 years starting at age 21 through age 56 or 3 with a history of 3 consecutive normal Pap tests.  HPV screening.** / Every 3 years from ages 24 through ages 1 to 46 with a history of 3 consecutive normal Pap tests.  Hepatitis C blood test.** / For any individual with known risks for hepatitis C.  Skin self-exam. / Monthly.  Influenza vaccine. / Every year.  Tetanus, diphtheria, and acellular pertussis (Tdap, Td) vaccine.** / Consult your health care provider. Pregnant women should receive 1 dose of Tdap vaccine during each pregnancy. 1 dose of Td every 10 years.  Varicella vaccine.** / Consult your health care provider. Pregnant females who do not have evidence of immunity should receive the first dose after pregnancy.  HPV vaccine. / 3 doses over 6 months, if 72 and younger. The vaccine is not recommended for use in pregnant females. However, pregnancy testing is not needed before receiving a dose.  Measles, mumps, rubella (MMR) vaccine.** / You need at least 1 dose of MMR if you were born in 1957 or later. You may also need a 2nd dose. For females of childbearing age, rubella immunity should be determined. If there is no evidence of immunity, females who are not pregnant should be vaccinated. If there is no evidence of immunity, females who are pregnant should delay immunization until after pregnancy.  Pneumococcal 13-valent conjugate (PCV13) vaccine.** / Consult your health care provider.  Pneumococcal polysaccharide (PPSV23) vaccine.** / 1 to 2 doses if you smoke cigarettes or if you have certain conditions.  Meningococcal vaccine.** /  1 dose if you are age 19 to 21 years and a first-year college  student living in a residence hall, or have one of several medical conditions, you need to get vaccinated against meningococcal disease. You may also need additional booster doses.  Hepatitis A vaccine.** / Consult your health care provider.  Hepatitis B vaccine.** / Consult your health care provider.  Haemophilus influenzae type b (Hib) vaccine.** / Consult your health care provider. Ages 40 to 64 years  Blood pressure check.** / Every 1 to 2 years.  Lipid and cholesterol check.** / Every 5 years beginning at age 20 years.  Lung cancer screening. / Every year if you are aged 55-80 years and have a 30-pack-year history of smoking and currently smoke or have quit within the past 15 years. Yearly screening is stopped once you have quit smoking for at least 15 years or develop a health problem that would prevent you from having lung cancer treatment.  Clinical breast exam.** / Every year after age 40 years.  BRCA-related cancer risk assessment.** / For women who have family members with a BRCA-related cancer (breast, ovarian, tubal, or peritoneal cancers).  Mammogram.** / Every year beginning at age 40 years and continuing for as long as you are in good health. Consult with your health care provider.  Pap test.** / Every 3 years starting at age 30 years through age 65 or 70 years with a history of 3 consecutive normal Pap tests.  HPV screening.** / Every 3 years from ages 30 years through ages 65 to 70 years with a history of 3 consecutive normal Pap tests.  Fecal occult blood test (FOBT) of stool. / Every year beginning at age 50 years and continuing until age 75 years. You may not need to do this test if you get a colonoscopy every 10 years.  Flexible sigmoidoscopy or colonoscopy.** / Every 5 years for a flexible sigmoidoscopy or every 10 years for a colonoscopy beginning at age 50 years and continuing until age 75 years.  Hepatitis C blood test.** / For all people born from 1945 through  1965 and any individual with known risks for hepatitis C.  Skin self-exam. / Monthly.  Influenza vaccine. / Every year.  Tetanus, diphtheria, and acellular pertussis (Tdap/Td) vaccine.** / Consult your health care provider. Pregnant women should receive 1 dose of Tdap vaccine during each pregnancy. 1 dose of Td every 10 years.  Varicella vaccine.** / Consult your health care provider. Pregnant females who do not have evidence of immunity should receive the first dose after pregnancy.  Zoster vaccine.** / 1 dose for adults aged 60 years or older.  Measles, mumps, rubella (MMR) vaccine.** / You need at least 1 dose of MMR if you were born in 1957 or later. You may also need a 2nd dose. For females of childbearing age, rubella immunity should be determined. If there is no evidence of immunity, females who are not pregnant should be vaccinated. If there is no evidence of immunity, females who are pregnant should delay immunization until after pregnancy.  Pneumococcal 13-valent conjugate (PCV13) vaccine.** / Consult your health care provider.  Pneumococcal polysaccharide (PPSV23) vaccine.** / 1 to 2 doses if you smoke cigarettes or if you have certain conditions.  Meningococcal vaccine.** / Consult your health care provider.  Hepatitis A vaccine.** / Consult your health care provider.  Hepatitis B vaccine.** / Consult your health care provider.  Haemophilus influenzae type b (Hib) vaccine.** / Consult your health care provider. Ages 65   years and over  Blood pressure check.** / Every 1 to 2 years.  Lipid and cholesterol check.** / Every 5 years beginning at age 66 years.  Lung cancer screening. / Every year if you are aged 84-80 years and have a 30-pack-year history of smoking and currently smoke or have quit within the past 15 years. Yearly screening is stopped once you have quit smoking for at least 15 years or develop a health problem that would prevent you from having lung cancer  treatment.  Clinical breast exam.** / Every year after age 27 years.  BRCA-related cancer risk assessment.** / For women who have family members with a BRCA-related cancer (breast, ovarian, tubal, or peritoneal cancers).  Mammogram.** / Every year beginning at age 76 years and continuing for as long as you are in good health. Consult with your health care provider.  Pap test.** / Every 3 years starting at age 57 years through age 62 or 29 years with 3 consecutive normal Pap tests. Testing can be stopped between 65 and 70 years with 3 consecutive normal Pap tests and no abnormal Pap or HPV tests in the past 10 years.  HPV screening.** / Every 3 years from ages 75 years through ages 102 or 39 years with a history of 3 consecutive normal Pap tests. Testing can be stopped between 65 and 70 years with 3 consecutive normal Pap tests and no abnormal Pap or HPV tests in the past 10 years.  Fecal occult blood test (FOBT) of stool. / Every year beginning at age 30 years and continuing until age 94 years. You may not need to do this test if you get a colonoscopy every 10 years.  Flexible sigmoidoscopy or colonoscopy.** / Every 5 years for a flexible sigmoidoscopy or every 10 years for a colonoscopy beginning at age 63 years and continuing until age 16 years.  Hepatitis C blood test.** / For all people born from [birth year] through 1967 and any individual with known risks for hepatitis C.  Osteoporosis screening.** / A one-time screening for women ages 47 years and over and women at risk for fractures or osteoporosis.  Skin self-exam. / Monthly.  Influenza vaccine. / Every year.  Tetanus, diphtheria, and acellular pertussis (Tdap/Td) vaccine.** / 1 dose of Td every 10 years.  Varicella vaccine.** / Consult your health care provider.  Zoster vaccine.** / 1 dose for adults aged 60 years or older.  Pneumococcal 13-valent conjugate (PCV13) vaccine.** / Consult your health care provider.  Pneumococcal  polysaccharide (PPSV23) vaccine.** / 1 dose for all adults aged 59 years and older.  Meningococcal vaccine.** / Consult your health care provider.  Hepatitis A vaccine.** / Consult your health care provider.  Hepatitis B vaccine.** / Consult your health care provider.  Haemophilus influenzae type b (Hib) vaccine.** / Consult your health care provider. ** Family history and personal history of risk and conditions may change your health care provider's recommendations. Document Released: 03/10/2001 Document Revised: 05/29/2013 Document Reviewed: 06/09/2010 [facility] Patient Information 2015 [place], [place]. This information is not intended to replace advice given to you by your health care provider. Make sure you discuss any questions you have with your health care provider.  Recommend better diet and more regular exercise as well.

## 2013-09-14 NOTE — Progress Notes (Signed)
Subjective:    Patient ID: Gwendolyn Weaver, female    DOB: Jul 17, 1960, 53 y.o.   MRN: [id number]  HPI   Pt in for cpe. She states needs one annually. Pt exercises twice a week walking 1 mile each time. No caffeine, alcohol or smoking. Pt does admit that she does have moderate poor diet. Eats out fairly often.  She is up to date on pap smear, mammogram, and colonoscopy(Also prior ekg done in 2011). Up to date on tetanus. Influenza vaccine to be done in October.   Pt been fatigued for last 2-3 month. 10 years mild anemia. No trasnfusion history.  Iron deficiency in past. Pt also recently some vaginal bleeding. Since march. Worked up by [employer]. Endometrial biopsy done neg.(Since June when last cbc done 3 episodoes of moderate vaginal bleed) Pt is going to see gyn tomorrow. Recently bleeding restarted. Pt has no hypothyroid hx.    Past Medical History  Diagnosis Date  . 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 Acid reflux disease     History   Social History  . Marital Status: Married    Spouse Name: N/A    Number of Children: N/A  . Years of Education: N/A   Occupational History  . Not on file.   Social History Main Topics  . Smoking status: Never Smoker   . Smokeless tobacco: Never Used  . Alcohol Use: No  . Drug Use: No  . Sexual Activity: Yes    Birth Control/ Protection: None   Other Topics Concern  . Not on file   Social History Narrative  . No narrative on file    Past Surgical History  Procedure Laterality Date  . Cesarean section      X2  . Hemorrhoid surgery    . 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  Marland Kitchen Hemorrhoid surgery  80'S    Family History  Problem Relation Age of Onset  . Hypertension Father   . Cancer Father 67    PROSTATE  . Other Father     circulation issues - bilateral amputations    No Known Allergies  Current Outpatient Prescriptions on File Prior to Visit    Medication Sig Dispense Refill  . acetaminophen (TYLENOL) 500 MG tablet Take 1,000 mg by mouth every 6 (six) hours as needed (headache).      Marland Kitchen BIOTIN PO Take by mouth.      . FIBER PO Take by mouth.      . Flaxseed, Linseed, (FLAX SEED OIL PO) Take by mouth.      . megestrol (MEGACE) 20 MG tablet Take 1 tablet (20 mg total) by mouth daily.  20 tablet  0  . Multiple Vitamin (MULTIVITAMIN) tablet Take 1 tablet by mouth daily.       Marland Kitchen omeprazole (PRILOSEC) 20 MG capsule Take 1 capsule (20 mg total) by mouth daily.  90 capsule  1  . medroxyPROGESTERone (PROVERA) 10 MG tablet Take 1 tablet (10 mg total) by mouth daily.  10 tablet  0  . medroxyPROGESTERone (PROVERA) 10 MG tablet Take 1 tablet (10 mg total) by mouth 2 (two) times daily.  10 tablet  0   No current facility-administered medications on file prior to visit.    BP 127/84  Pulse 79  Temp(Src) 98.3 F (36.8 C)  Ht 5' 7.5" (1.715 m)  Wt 194 lb (87.998 kg)  BMI 29.92 kg/m2  SpO2 99%    Review of Systems  Constitutional: Positive for fatigue. Negative for fever and chills.  HENT: Positive for congestion. Negative for ear pain, facial swelling, nosebleeds, postnasal drip, rhinorrhea, sinus pressure, sore throat, tinnitus and voice change.        Chronic nothing unusual.  Respiratory: Negative for cough, chest tightness and wheezing.   Cardiovascular: Negative for chest pain and palpitations.  Gastrointestinal: Negative for nausea, vomiting, abdominal pain, diarrhea, constipation, blood in stool, abdominal distention and rectal pain.  Endocrine: Negative for polydipsia, polyphagia and polyuria.  Genitourinary: Negative for dysuria, urgency, frequency, flank pain, decreased urine volume, vaginal bleeding, vaginal discharge, vaginal pain and pelvic pain.       Irregular occasional bleeding. Endometrial biopsy neg.  Neurological: Negative for dizziness, tremors, seizures, facial asymmetry, weakness, light-headedness, numbness and  headaches.  Hematological: Negative for adenopathy. Does not bruise/bleed easily.  Psychiatric/Behavioral: Negative for suicidal ideas, hallucinations, confusion, sleep disturbance, dysphoric mood, decreased concentration and agitation. The patient is not nervous/anxious.        Objective:   Physical Exam  General Mental Status- Alert. General Appearance- Not in acute distress.  Skin General:-Color-Normal Color. Moisture- Normal Moisture.  Neck Carotid Arteries- normal upstroke and runoff. No JVD. No bruits.  HEENT Eyes- PEERL.  Nose- no frontal or maxillary sinus tender. Mild boggy turbinates.  Throat- normal. No pnd.  Chest and Lung Exam Auscultation:Rhythm- Regular. Murmurs & Other Heart Sounds: Auscultation of the heart reveals- No murmurs.  Abdomen Inspection:-Inspection Normal. Palpation/Percussion:Note:No mass. Palpation and Percussion of the abdomen reveal- Non Tender.  Back No cva tenderness.  Neurologic CN III-XII grossly intact, negative romberg, no gross motor or sensory function deficits.         Assessment & Plan:  I did ask pt to call her insurance to see if shingles vaccine covered. I did put in order for dexascan. Pt schedule to do that limited due to upcoming vacation so asked referral staff to call pt and arrange date and location prior to that being scheduled.  I did charge her for v70.0 today. She has complaint of fatigue. That is not entirely new. May be associated with irregalar vaginal bleeding for which specialist will see her tomorrow. I did routine cmp, cbc, tsh for physical today. This would capture anemia if that is the case.

## 2013-09-14 NOTE — Progress Notes (Signed)
Pre visit review using our clinic review tool, if applicable. No additional management support is needed unless otherwise documented below in the visit note. 

## 2013-09-15 ENCOUNTER — Encounter: Payer: Self-pay | Admitting: Gynecology

## 2013-09-15 ENCOUNTER — Ambulatory Visit (INDEPENDENT_AMBULATORY_CARE_PROVIDER_SITE_OTHER): Payer: BC Managed Care – PPO | Admitting: Gynecology

## 2013-09-15 DIAGNOSIS — N950 Postmenopausal bleeding: Secondary | ICD-10-CM

## 2013-09-15 MED ORDER — MEGESTROL ACETATE 20 MG PO TABS: 20.0000 mg | ORAL_TABLET | Freq: Every day | ORAL | Status: DC

## 2013-09-15 NOTE — Patient Instructions (Signed)
Followup for surgery as scheduled by the office.

## 2013-09-15 NOTE — Progress Notes (Signed)
Gwendolyn Weaver 1960/09/30 [id number]        53 y.o.  G2P1001 presents with continued irregular bleeding. It started with less frequent menses in 2013 with LMP November 2014. She began spotting on and off March 2015 was treated with a short course of Provera but then started bleeding heavier. She underwent a sonohysterogram 07/21/2013 which showed 3 small myomas and an endometrial echo of 16.8 mm. The endometrial cavity appeared clear with some thickening on the posterior wall of the endometrium. Endometrial sample showed " endometrioid type polyps". At that point the plan was for expectant management but she has continued to bleed on and off and this week it was heavier.  Past medical history,surgical history, problem list, medications, allergies, family history and social history were all reviewed and documented in the EPIC chart.  Directed ROS with pertinent positives and negatives documented in the history of present illness/assessment and plan.  Exam: Kim assistant General appearance:  Normal HEENT normal Lungs clear Cardiac regular rate no rubs murmurs or gallops Abdomen soft nontender without masses guarding rebound Pelvic external BUS vagina with menses flow. Cervix grossly normal with menses flow. Uterus globoid grossly normal size midline mobile nontender. Adnexa without masses or tenderness  Assessment/Plan:  53 y.o. G2P1001 with persistent perimenopausal bleeding. Ultrasound showed thickening along the posterior endometrium but no clear polyps. Endometrial sample was benign showing endometrioid type polyps. Has continued to bleed on and off. We'll acutely treated with Megace 20 mg daily and recommended proceeding with hysteroscopy D&C to clear the endometrium. I reviewed with the patient was involved with the procedure to include the intraoperative and postoperative courses as well as the recovery period. The risks to include infection, hemorrhage necessitating transfusion and the risks  of transfusion including transfusion reaction, hepatitis, HIV, mad cow disease and other unknown entities were reviewed. The risk of uterine perforation and damage to internal organs including bowel, bladder, ureters, vessels and nerves necessitating major exploratory repair surgeries and future reparative surgeries including ostomy formation, bowel resection, bladder repair, ureteral damage repair was reviewed with her. The risk of distended media absorption leading to metabolic complications such as fluid overload, coma and seizures was reviewed. No guarantees as far as bleeding relief discussed. The patient's questions were answered and she agrees with scheduling the surgery.  Note: This document was prepared with digital dictation and possible smart phrase technology. Any transcriptional errors that result from this process are unintentional.   Anastasio Auerbach MD, 8:19 AM 09/15/2013

## 2013-09-18 ENCOUNTER — Telehealth: Payer: Self-pay

## 2013-09-18 MED ORDER — MISOPROSTOL 200 MCG PO TABS: ORAL_TABLET | ORAL | Status: DC

## 2013-09-18 NOTE — Telephone Encounter (Signed)
I called patient and scheduled her Hysteroscopy, D&C for 10/19/13 at 1:00pm at [facility].  I advised her regarding the need for a Cytotec tab vaginally then night before surgery.  Rx sent.

## 2013-09-18 NOTE — Telephone Encounter (Signed)
error 

## 2013-09-19 ENCOUNTER — Encounter: Payer: Self-pay | Admitting: Gynecology

## 2013-10-09 ENCOUNTER — Encounter (HOSPITAL_COMMUNITY): Payer: Self-pay | Admitting: Pharmacist

## 2013-10-16 NOTE — H&P (Signed)
  Gwendolyn Weaver 1960-09-30 [id number]   History and Physical  Chief complaint: postmenopausal bleeding  History of present illness: 53 y.o. G2P1001 with continued irregular bleeding. It started with less frequent menses in 2013 with LMP November 2014. She began spotting on and off March 2015 was treated with a short course of Provera but then started bleeding heavier. She underwent a sonohysterogram 07/21/2013 which showed 3 small myomas and an endometrial echo of 16.8 mm. The endometrial cavity appeared clear with some thickening on the posterior wall of the endometrium. Endometrial sample showed " endometrioid type polyps". At that point the plan was for expectant management but she has continued to bleed on and off    Past medical history,surgical history, medications, allergies, family history and social history were all reviewed and documented in the EPIC chart.  ROS:  Was performed and pertinent positives and negatives are included in the history of present illness.  Exam:  Kim assistant 09/15/2013 General appearance: Normal  HEENT normal  Lungs clear  Cardiac regular rate no rubs murmurs or gallops  Abdomen soft nontender without masses guarding rebound  Pelvic external BUS vagina with menses flow. Cervix grossly normal with menses flow. Uterus globoid grossly normal size midline mobile nontender. Adnexa without masses or tenderness   Assessment/Plan:  53 y.o. G2P1001 with persistent perimenopausal bleeding. Ultrasound showed thickening along the posterior endometrium but no clear polyps. Endometrial sample was benign showing endometrioid type polyps. Has continued to bleed on and off. The patient was treated with Megace 20 mg daily to control the bleeding and recommended to proceed with hysteroscopy D&C to clear the endometrium. I reviewed with the patient what is involved with the procedure to include the intraoperative and postoperative courses as well as the recovery period. The  risks to include infection, prolonged antibiotics, hemorrhage necessitating transfusion and the risks of transfusion including transfusion reaction, hepatitis, HIV, mad cow disease and other unknown entities were reviewed. The risk of uterine perforation and damage to internal organs including bowel, bladder, ureters, vessels and nerves necessitating major exploratory reparative surgeries and future reparative surgeries including ostomy formation, bowel resection, bladder repair, ureteral damage repair was reviewed with her. The risk of distended media absorption leading to metabolic complications such as fluid overload, coma and seizures was reviewed. No guarantees as far as bleeding relief discussed. The patient's questions were answered and she agrees to proceed with surgery.     Anastasio Auerbach MD, 2:36 PM 10/16/2013

## 2013-10-18 MED ORDER — DEXTROSE 5 % IV SOLN
2.0000 g | INTRAVENOUS | Status: AC
  Administered 2013-10-19: 2 g via INTRAVENOUS
  Filled 2013-10-18: qty 2

## 2013-10-19 ENCOUNTER — Encounter (HOSPITAL_COMMUNITY)
Admission: RE | Disposition: A | Discharge status: Home / Self Care | Payer: Self-pay | Source: Ambulatory Visit | Attending: Gynecology

## 2013-10-19 ENCOUNTER — Ambulatory Visit (HOSPITAL_COMMUNITY)
Admission: RE | Admit: 2013-10-19 | Discharge: 2013-10-19 | Disposition: A | Discharge status: Home / Self Care | Payer: BC Managed Care – PPO | Source: Ambulatory Visit | Attending: Gynecology | Admitting: Gynecology

## 2013-10-19 ENCOUNTER — Ambulatory Visit (HOSPITAL_COMMUNITY): Payer: BC Managed Care – PPO | Admitting: Anesthesiology

## 2013-10-19 ENCOUNTER — Encounter (HOSPITAL_COMMUNITY): Payer: BC Managed Care – PPO | Admitting: Anesthesiology

## 2013-10-19 ENCOUNTER — Encounter (HOSPITAL_COMMUNITY): Payer: Self-pay | Admitting: *Deleted

## 2013-10-19 DIAGNOSIS — N950 Postmenopausal bleeding: Secondary | ICD-10-CM

## 2013-10-19 DIAGNOSIS — N840 Polyp of corpus uteri: Secondary | ICD-10-CM | POA: Insufficient documentation

## 2013-10-19 DIAGNOSIS — R011 Cardiac murmur, unspecified: Secondary | ICD-10-CM | POA: Insufficient documentation

## 2013-10-19 DIAGNOSIS — K219 Gastro-esophageal reflux disease without esophagitis: Secondary | ICD-10-CM | POA: Diagnosis not present

## 2013-10-19 HISTORY — DX: Adverse effect of unspecified anesthetic, initial encounter: T41.45XA

## 2013-10-19 HISTORY — DX: Other complications of anesthesia, initial encounter: T88.59XA

## 2013-10-19 HISTORY — PX: DILATATION & CURETTAGE/HYSTEROSCOPY WITH TRUECLEAR: SHX6353

## 2013-10-19 LAB — CBC
HCT: 39.3 % (ref 36.0–46.0)
Hemoglobin: 13.6 g/dL (ref 12.0–15.0)
MCH: 30.9 pg (ref 26.0–34.0)
MCHC: 34.6 g/dL (ref 30.0–36.0)
MCV: 89.3 fL (ref 78.0–100.0)
PLATELETS: 260 10*3/uL (ref 150–400)
RBC: 4.4 MIL/uL (ref 3.87–5.11)
RDW: 13.6 % (ref 11.5–15.5)
WBC: 5.2 10*3/uL (ref 4.0–10.5)

## 2013-10-19 SURGERY — DILATATION & CURETTAGE/HYSTEROSCOPY WITH TRUCLEAR
Anesthesia: General

## 2013-10-19 MED ORDER — SODIUM CHLORIDE 0.9 % IR SOLN
Status: DC | PRN
  Administered 2013-10-19: 1000 mL

## 2013-10-19 MED ORDER — SCOPOLAMINE 1 MG/3DAYS TD PT72
1.0000 | MEDICATED_PATCH | Freq: Once | TRANSDERMAL | Status: DC
  Administered 2013-10-19: 1.5 mg via TRANSDERMAL

## 2013-10-19 MED ORDER — LIDOCAINE HCL (CARDIAC) 20 MG/ML IV SOLN
INTRAVENOUS | Status: DC | PRN
  Administered 2013-10-19: 50 mg via INTRAVENOUS

## 2013-10-19 MED ORDER — DEXAMETHASONE SODIUM PHOSPHATE 4 MG/ML IJ SOLN
INTRAMUSCULAR | Status: AC
  Filled 2013-10-19: qty 1

## 2013-10-19 MED ORDER — KETOROLAC TROMETHAMINE 30 MG/ML IJ SOLN
INTRAMUSCULAR | Status: AC
  Filled 2013-10-19: qty 1

## 2013-10-19 MED ORDER — FENTANYL CITRATE 0.05 MG/ML IJ SOLN: 25.0000 ug | INTRAMUSCULAR | Status: DC | PRN

## 2013-10-19 MED ORDER — MIDAZOLAM HCL 2 MG/2ML IJ SOLN
INTRAMUSCULAR | Status: AC
  Filled 2013-10-19: qty 2

## 2013-10-19 MED ORDER — KETOROLAC TROMETHAMINE 30 MG/ML IJ SOLN
INTRAMUSCULAR | Status: DC | PRN
  Administered 2013-10-19: 15 mg via INTRAVENOUS

## 2013-10-19 MED ORDER — MIDAZOLAM HCL 2 MG/2ML IJ SOLN
INTRAMUSCULAR | Status: DC | PRN
  Administered 2013-10-19: 2 mg via INTRAVENOUS

## 2013-10-19 MED ORDER — LIDOCAINE HCL 1 % IJ SOLN
INTRAMUSCULAR | Status: AC
  Filled 2013-10-19: qty 20

## 2013-10-19 MED ORDER — PROPOFOL 10 MG/ML IV EMUL
INTRAVENOUS | Status: AC
  Filled 2013-10-19: qty 20

## 2013-10-19 MED ORDER — MEPERIDINE HCL 25 MG/ML IJ SOLN: 6.2500 mg | INTRAMUSCULAR | Status: DC | PRN

## 2013-10-19 MED ORDER — ONDANSETRON HCL 4 MG/2ML IJ SOLN
INTRAMUSCULAR | Status: AC
  Filled 2013-10-19: qty 2

## 2013-10-19 MED ORDER — DEXAMETHASONE SODIUM PHOSPHATE 10 MG/ML IJ SOLN
INTRAMUSCULAR | Status: DC | PRN
  Administered 2013-10-19: 4 mg via INTRAVENOUS

## 2013-10-19 MED ORDER — PROPOFOL 10 MG/ML IV BOLUS
INTRAVENOUS | Status: DC | PRN
  Administered 2013-10-19: 180 mg via INTRAVENOUS

## 2013-10-19 MED ORDER — ONDANSETRON HCL 4 MG/2ML IJ SOLN: INTRAMUSCULAR | Status: DC | PRN

## 2013-10-19 MED ORDER — LIDOCAINE HCL 1 % IJ SOLN
INTRAMUSCULAR | Status: DC | PRN
  Administered 2013-10-19: 10 mL

## 2013-10-19 MED ORDER — LIDOCAINE HCL (CARDIAC) 20 MG/ML IV SOLN
INTRAVENOUS | Status: AC
  Filled 2013-10-19: qty 5

## 2013-10-19 MED ORDER — FENTANYL CITRATE 0.05 MG/ML IJ SOLN
INTRAMUSCULAR | Status: DC | PRN
Start: 2013-10-19 — End: 2013-10-19
  Administered 2013-10-19: 100 ug via INTRAVENOUS

## 2013-10-19 MED ORDER — LACTATED RINGERS IV SOLN
INTRAVENOUS | Status: DC
  Administered 2013-10-19 (×2): via INTRAVENOUS

## 2013-10-19 MED ORDER — OXYCODONE-ACETAMINOPHEN 5-325 MG PO TABS: 1.0000 | ORAL_TABLET | ORAL | Status: DC | PRN

## 2013-10-19 MED ORDER — ONDANSETRON HCL 4 MG/2ML IJ SOLN
INTRAMUSCULAR | Status: DC | PRN
  Administered 2013-10-19: 4 mg via INTRAVENOUS

## 2013-10-19 MED ORDER — FENTANYL CITRATE 0.05 MG/ML IJ SOLN
INTRAMUSCULAR | Status: AC
  Filled 2013-10-19: qty 5

## 2013-10-19 MED ORDER — METOCLOPRAMIDE HCL 5 MG/ML IJ SOLN: 10.0000 mg | Freq: Once | INTRAMUSCULAR | Status: DC | PRN

## 2013-10-19 MED ORDER — SCOPOLAMINE 1 MG/3DAYS TD PT72
MEDICATED_PATCH | TRANSDERMAL | Status: AC
  Administered 2013-10-19: 1.5 mg via TRANSDERMAL
  Filled 2013-10-19: qty 1

## 2013-10-19 SURGICAL SUPPLY — 15 items
BLADE INCISOR TRUC PLUS 2.9 (ABLATOR) IMPLANT
CANISTERS HI-FLOW 3000CC (CANNISTER) IMPLANT
CATH ROBINSON RED A/P 16FR (CATHETERS) ×2 IMPLANT
CLOTH BEACON ORANGE TIMEOUT ST (SAFETY) ×2 IMPLANT
CONTAINER PREFILL 10% NBF 60ML (FORM) ×4 IMPLANT
DRAPE HYSTEROSCOPY (DRAPE) ×2 IMPLANT
GLOVE BIO SURGEON STRL SZ7.5 (GLOVE) ×2 IMPLANT
GOWN STRL REUS W/TWL LRG LVL3 (GOWN DISPOSABLE) ×4 IMPLANT
INCISOR TRUC PLUS BLADE 2.9 (ABLATOR)
KIT HYSTEROSCOPY TRUCLEAR (ABLATOR) IMPLANT
MORCELLATOR RECIP TRUCLEAR 4.0 (ABLATOR) IMPLANT
PACK VAGINAL MINOR WOMEN LF (CUSTOM PROCEDURE TRAY) ×2 IMPLANT
PAD OB MATERNITY 4.3X12.25 (PERSONAL CARE ITEMS) ×2 IMPLANT
TOWEL OR 17X24 6PK STRL BLUE (TOWEL DISPOSABLE) ×4 IMPLANT
WATER STERILE IRR 1000ML POUR (IV SOLUTION) ×2 IMPLANT

## 2013-10-19 NOTE — Anesthesia Postprocedure Evaluation (Signed)
  Anesthesia Post-op Note  Patient: Gwendolyn Weaver  Procedure(s) Performed: Procedure(s): DILATATION & CURETTAGE/HYSTEROSCOPY  (N/A) Patient is awake and responsive. Pain and nausea are reasonably well controlled. Vital signs are stable and clinically acceptable. Oxygen saturation is clinically acceptable. There are no apparent anesthetic complications at this time. Patient is ready for discharge.

## 2013-10-19 NOTE — Discharge Instructions (Signed)
Postoperative Instructions Hysteroscopy D & C  Dr. Phineas Real and the nursing staff have discussed postoperative instructions with you.  If you have any questions please ask them before you leave the hospital, or call Dr Elisabeth Most office at [phone].    We would like to emphasize the following instructions:     Call the office to make your follow-up appointment as recommended by Dr Phineas Real (usually 1-2 weeks).    You were given a prescription, or one was ordered for you at the pharmacy you designated.  Get that prescription filled and take the medication according to instructions.    You may eat a regular diet, but slowly until you start having bowel movements.    Drink plenty of water daily.    Nothing in the vagina (intercourse, douching, objects of any kind) for two weeks.  When reinitiating intercourse, if it is uncomfortable, stop and make an appointment with Dr Phineas Real to be evaluated.    No driving for one to two days until the effects of anesthesia has worn off.  No traveling out of town for several days.    You may shower, but no baths for one week.  Walking up and down stairs is ok.  No heavy lifting, prolonged standing, repeated bending or any working out until your post op check.    Rest frequently, listen to your body and do not push yourself and overdo it.    Call if:  o Your pain medication does not seem strong enough. o Worsening pain or abdominal bloating o Persistent nausea or vomiting o Difficulty with urination or bowel movements. o Temperature of 101 degrees or higher. o Heavy vaginal bleeding.  If your period is due, you may use tampons. o You have any questions or concerns   DISCHARGE INSTRUCTIONS: D&C / D&E The following instructions have been prepared to help you care for yourself upon your return home.   Personal hygiene:  Use sanitary pads for vaginal drainage, not tampons.  Shower the day after your procedure.  NO tub baths, pools or  Jacuzzis for 2-3 weeks.  Wipe front to back after using the bathroom.  Activity and limitations:  Do NOT drive or operate any equipment for 24 hours. The effects of anesthesia are still present and drowsiness may result.  Do NOT rest in bed all day.  Walking is encouraged.  Walk up and down stairs slowly.  You may resume your normal activity in one to two days or as indicated by your physician.  Sexual activity: NO intercourse for at least 2 weeks after the procedure, or as indicated by your physician.  Diet: Eat a light meal as desired this evening. You may resume your usual diet tomorrow.  Return to work: You may resume your work activities in one to two days or as indicated by your doctor.  What to expect after your surgery: Expect to have vaginal bleeding/discharge for 2-3 days and spotting for up to 10 days. It is not unusual to have soreness for up to 1-2 weeks. You may have a slight burning sensation when you urinate for the first day. Mild cramps may continue for a couple of days. You may have a regular period in 2-6 weeks.  Call your doctor for any of the following:  Excessive vaginal bleeding, saturating and changing one pad every hour.  Inability to urinate 6 hours after discharge from hospital.  Pain not relieved by pain medication.  Fever of 100.4 F or greater.  Unusual  vaginal discharge or odor.   Call for an appointment:    Patients signature: ______________________  Nurses signature ________________________  Support person's signature_______________________

## 2013-10-19 NOTE — Op Note (Signed)
Gwendolyn Weaver 02/15/1960 [id number]   Post Operative Note   Date of surgery:  10/19/2013  Pre Op Dx:  Postmenopausal bleeding  Post Op Dx:  Postmenopausal bleeding  Procedure:  Hysteroscopy D&C  Surgeon:  Anastasio Auerbach  Anesthesia:  General  EBL:  minimal  Distended media discrepancy:  minimal  Complications:  None  Specimen:  Endometrial curetting to pathology  Findings: EUA:  External BUS vagina normal. Cervix normal. Uterus grossly normal size midline mobile. Adnexa without masses   Hysteroscopy: Adequate with fundus, anterior/posterior uterine surfaces, lower uterine segment, endocervical canal and right/left tubal ostia all visualized. Generalized hyperplastic pattern with micropapillary appearance throughout.  Procedure:  The patient was taken to the operating room, underwent general anesthesia, received a perineal/vaginal preparation with Betadine solution and the bladder emptied with an in and out Foley catheterization by nursing personnel. The time out was performed by the surgical team. The patient was draped in the usual fashion an EUA was performed. The cervix was visualized with a speculum, anterior lip grasped with a single-tooth tenaculum and a paracervical block using 1% lidocaine was placed, a total of 10 cc. The cervix was gently and gradually dilated to admit the small Truclear hysteroscope and hysteroscopy was performed with findings noted above. A sharp curettage was then performed to empty the cavity and the specimen was sent to pathology. Repeat hysteroscopy showed an empty cavity with good distention and no evidence of perforation. The tenaculum was removed and good hemostasis visualized at both the tenaculum sites and the external cervical os. The patient received intraoperative Toradol, was awakened without difficulty and taken to the recovery room in good condition having tolerated the procedure well.     Anastasio Auerbach MD, 2:00 PM  10/19/2013

## 2013-10-19 NOTE — Anesthesia Preprocedure Evaluation (Signed)
Anesthesia Evaluation  Patient identified by MRN, date of birth, ID band Patient awake    Reviewed: Allergy & Precautions, H&P , NPO status , Patient's Chart, lab work & pertinent test results  History of Anesthesia Complications (+) history of anesthetic complications  Airway Mallampati: III TM Distance: >3 FB Neck ROM: Full    Dental no notable dental hx. (+) Partial Upper   Pulmonary shortness of breath and with exertion,  breath sounds clear to auscultation  Pulmonary exam normal       Cardiovascular negative cardio ROS  Rhythm:Regular Rate:Normal + Systolic murmurs    Neuro/Psych negative neurological ROS  negative psych ROS   GI/Hepatic Neg liver ROS, GERD-  Medicated and Controlled,Hiatal hernia   Endo/Other  Obesity  Renal/GU negative Renal ROS  negative genitourinary   Musculoskeletal negative musculoskeletal ROS (+)   Abdominal (+) + obese,   Peds  Hematology negative hematology ROS (+)   Anesthesia Other Findings   Reproductive/Obstetrics PMB                           Anesthesia Physical Anesthesia Plan  ASA: II  Anesthesia Plan: General   Post-op Pain Management:    Induction: Intravenous  Airway Management Planned: LMA  Additional Equipment:   Intra-op Plan:   Post-operative Plan: Extubation in OR  Informed Consent: I have reviewed the patients History and Physical, chart, labs and discussed the procedure including the risks, benefits and alternatives for the proposed anesthesia with the patient or authorized representative who has indicated his/her understanding and acceptance.   Dental advisory given  Plan Discussed with: CRNA, Anesthesiologist and Surgeon  Anesthesia Plan Comments:         Anesthesia Quick Evaluation

## 2013-10-19 NOTE — H&P (Signed)
  The patient was examined.  I reviewed the proposed surgery and consent form with the patient.  The dictated history and physical is current and accurate and all questions were answered. The patient is ready to proceed with surgery and has a realistic understanding and expectation for the outcome.   Anastasio Auerbach MD, 12:48 PM 10/19/2013

## 2013-10-19 NOTE — Transfer of Care (Signed)
Immediate Anesthesia Transfer of Care Note  Patient: Gwendolyn Weaver  Procedure(s) Performed: Procedure(s): DILATATION & CURETTAGE/HYSTEROSCOPY  (N/A)  Patient Location: PACU  Anesthesia Type:General  Level of Consciousness: awake  Airway & Oxygen Therapy: Patient Spontanous Breathing  Post-op Assessment: Report given to PACU RN  Post vital signs: stable  Filed Vitals:   10/19/13 1101  BP: 135/87  Pulse: 68  Temp: 36.7 C  Resp: 18    Complications: No apparent anesthesia complications

## 2013-10-20 ENCOUNTER — Other Ambulatory Visit: Payer: Self-pay | Admitting: Family Medicine

## 2013-10-20 ENCOUNTER — Encounter (HOSPITAL_COMMUNITY): Payer: Self-pay | Admitting: Gynecology

## 2013-10-20 NOTE — Telephone Encounter (Signed)
Last OV: 09/14/13 Last Filled: 06/13/13 Amt: 90,1   Refilled 90 capsules, 0 refills

## 2013-10-25 IMAGING — MG MM DIGITAL SCREENING BILAT
4 series · 4 of 4 positions shown · non-contrast
Comparison: Previous exams.

CLINICAL DATA: Screening.

DIGITAL SCREENING BILATERAL MAMMOGRAM WITH CAD

[R CC]
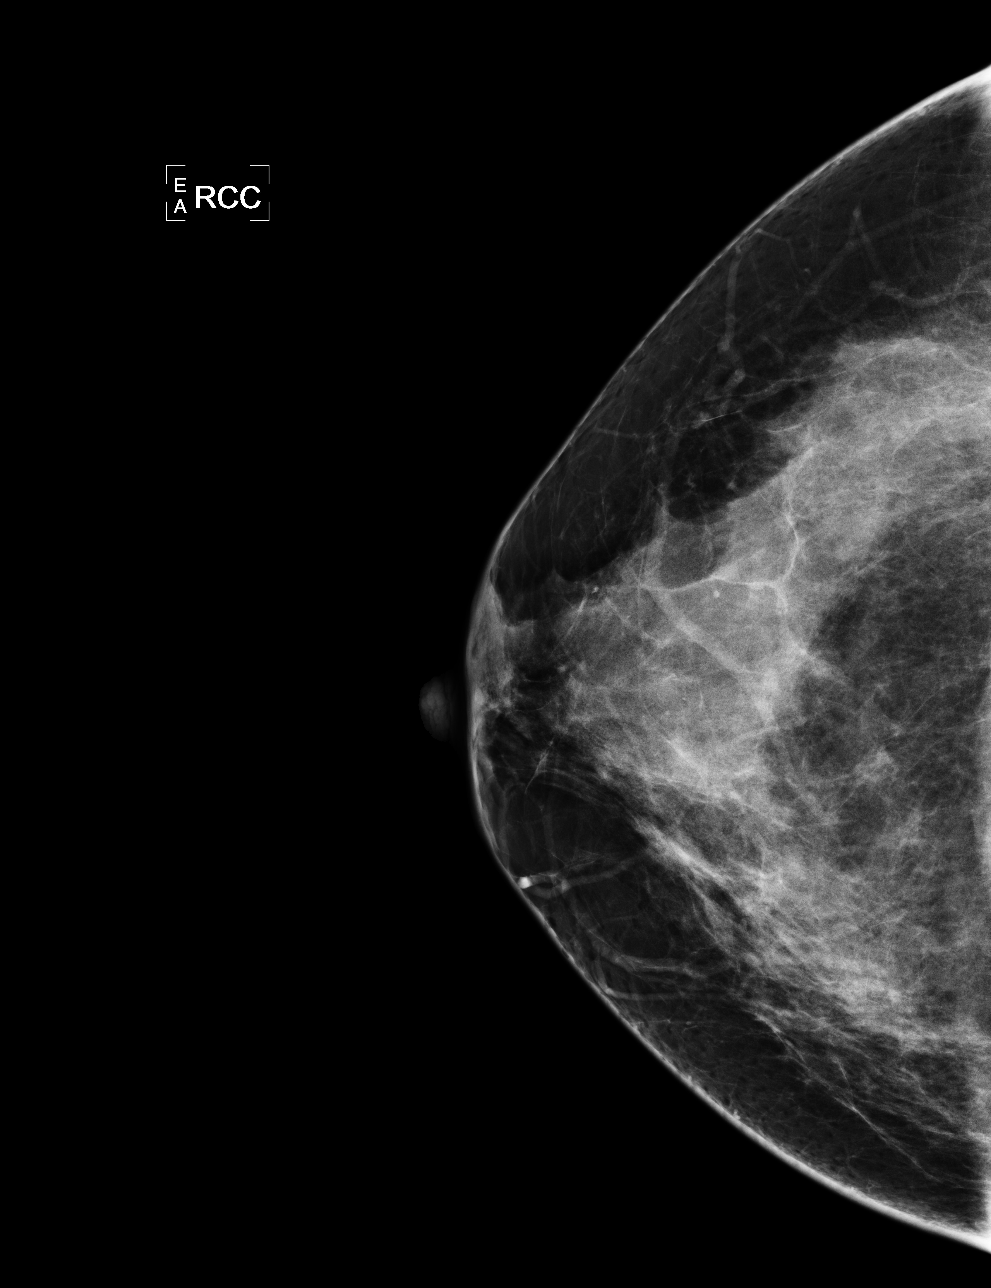

[L CC]
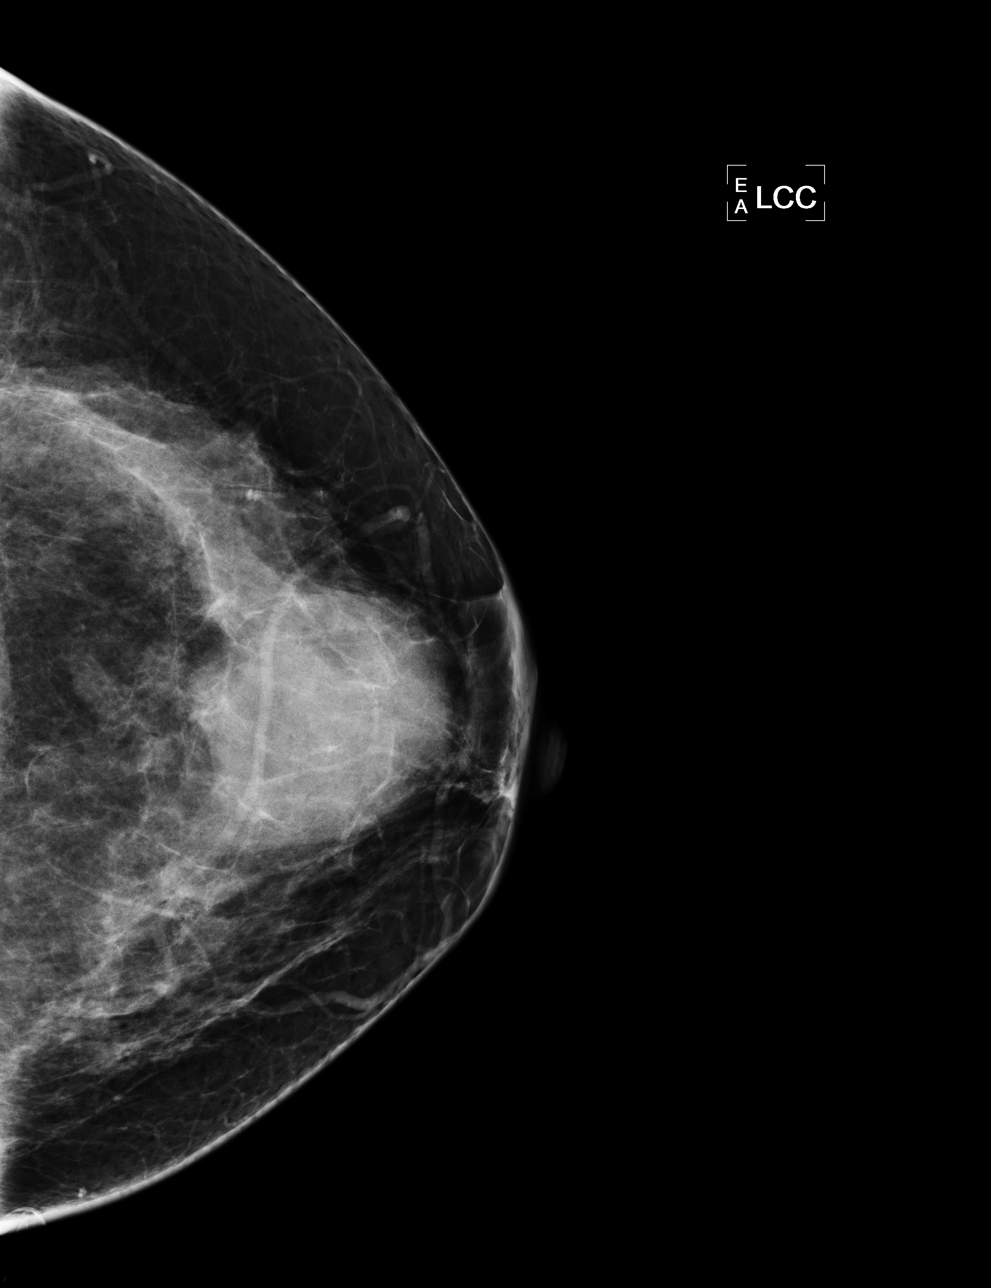

[L MLO]
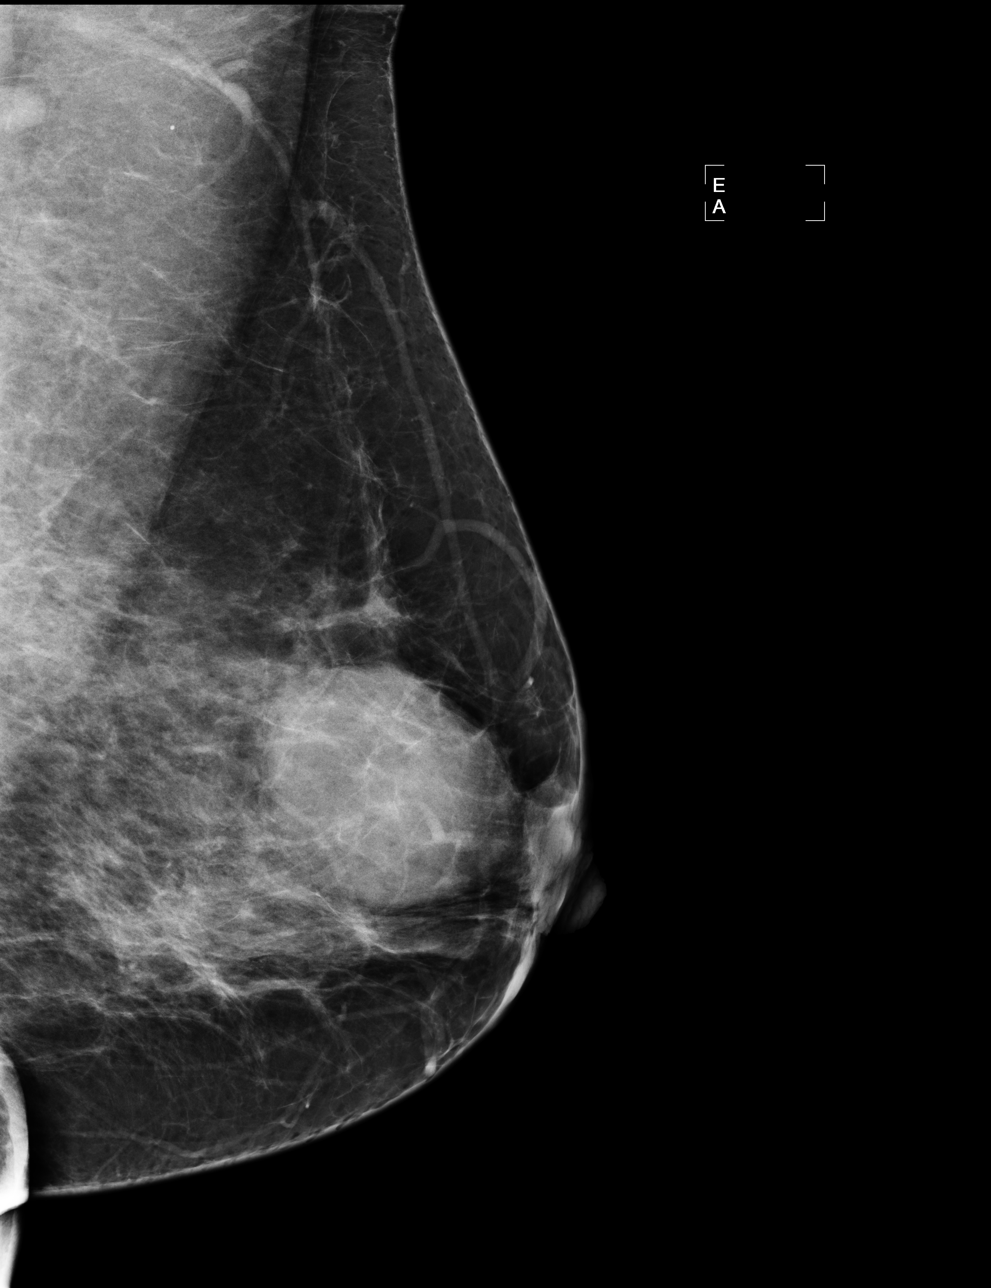

[R MLO]
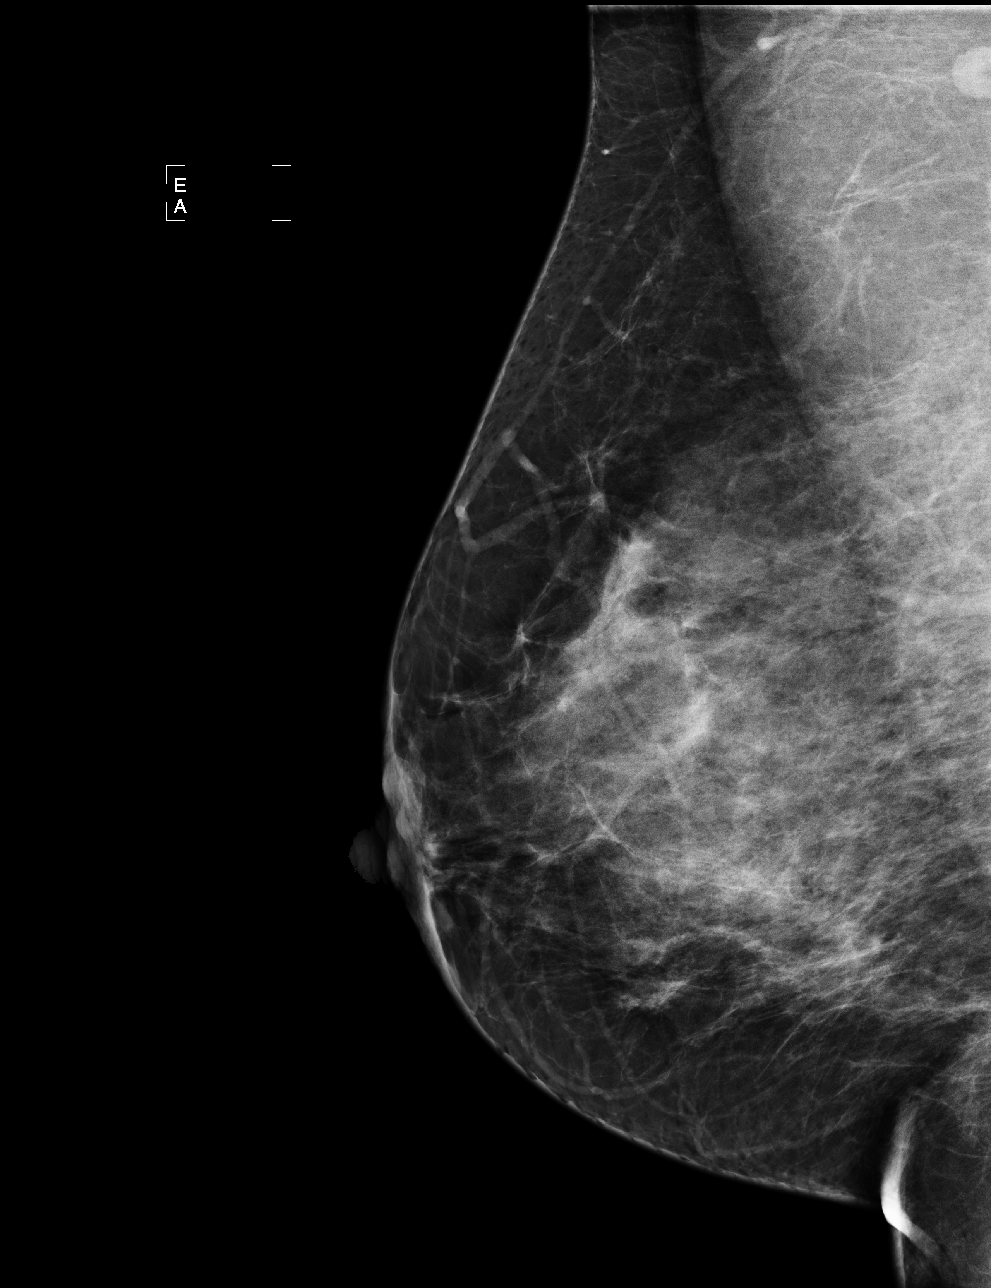

[4 of 4 positions shown; findings below may reference images not displayed]

FINDINGS: ACR Breast Density Category 3: The breast tissue is heterogeneously
dense.

There are no findings suspicious for malignancy. There is a cyst
again noted located within the subareolar portion of the left
breast.

Images were processed with CAD.
IMPRESSION: No mammographic evidence of malignancy.

A result letter of this screening mammogram will be mailed directly
to the patient.

RECOMMENDATION:
Screening mammogram in one year. (Code:[id number]-W-UH3)

BI-RADS CATEGORY 2:  Benign finding(s).

## 2013-10-30 ENCOUNTER — Telehealth: Payer: Self-pay | Admitting: *Deleted

## 2013-10-30 MED ORDER — MEGESTROL ACETATE 20 MG PO TABS: 20.0000 mg | ORAL_TABLET | Freq: Every day | ORAL | Status: DC

## 2013-10-30 NOTE — Telephone Encounter (Signed)
Pt post D&C on 10/19/13 notice some spotting couple of days after, then bleeding increased on 10/21/13. Pt said today she is wearing tampon and pad changing about every 45 minutes to 1 hour with some clots slight cramping. Please advise

## 2013-10-30 NOTE — Telephone Encounter (Signed)
Megace 20 mg daily x10 days

## 2013-10-30 NOTE — Telephone Encounter (Signed)
rx sent, pt informed.  

## 2013-11-22 ENCOUNTER — Encounter: Payer: Self-pay | Admitting: Gynecology

## 2013-11-22 ENCOUNTER — Ambulatory Visit (INDEPENDENT_AMBULATORY_CARE_PROVIDER_SITE_OTHER): Payer: BC Managed Care – PPO | Admitting: Gynecology

## 2013-11-22 DIAGNOSIS — Z9889 Other specified postprocedural states: Secondary | ICD-10-CM

## 2013-11-22 NOTE — Progress Notes (Signed)
Gwendolyn Weaver [birth year] [id number]        53 y.o.  G2P1001 Presents for postoperative checkup status post hysteroscopy D&C for endometrial polyp. Did have some bleeding several days after the surgery that was treated with Megace. Her bleeding has resolved. Is doing well now without complaints.  Past medical history,surgical history, problem list, medications, allergies, family history and social history were all reviewed and documented in the EPIC chart.  Directed ROS with pertinent positives and negatives documented in the history of present illness/assessment and plan.  Exam: Kim assistant General appearance:  Normal Abdomen soft nontender without masses guarding rebound Pelvic external BUS vagina normal. Cervix normal. Uterus normal size midline mobile nontender. Adnexa without masses or tenderness   Assessment/Plan:  53 y.o. G2P1001 was normal postop check status post hysteroscopy D&C for endometrial polyp. Reviewed pathology and pictures from the surgery. Patient asked what we would do if her bleeding continue. I discussed options to include hormonal manipulation, Mirena IUD, endometrial ablation, hysterectomy. Patient will follow up if continued bleeding. Assuming she does well then she'll follow up in June 2016 when she is due for her annual exam.     Anastasio Auerbach MD, 11:58 AM 11/22/2013

## 2013-11-22 NOTE — Patient Instructions (Signed)
Follow up if you have continued bleeding. Follow up in June 2016 when you're due for your annual exam.

## 2013-11-27 ENCOUNTER — Encounter: Payer: Self-pay | Admitting: Gynecology

## 2013-11-28 ENCOUNTER — Telehealth: Payer: Self-pay | Admitting: *Deleted

## 2013-11-28 MED ORDER — MEGESTROL ACETATE 20 MG PO TABS
ORAL_TABLET | ORAL | Status: DC
Start: 2013-11-28 — End: 2013-12-27

## 2013-11-28 NOTE — Telephone Encounter (Signed)
Pt called to follow up from [place] 11/22/13 post hysteroscopy D&C for endometrial polyp, states bleeding started on Saturday, medium flow changing pads every 3 hours with clots. Pt was told to call if bleeding should occur. Please advise

## 2013-11-28 NOTE — Telephone Encounter (Signed)
Megace 20 mg twice a day times several days until bleeding slows then daily for 1 week

## 2013-11-28 NOTE — Telephone Encounter (Signed)
Left the below on pt voicemail, rx sent

## 2013-12-24 ENCOUNTER — Other Ambulatory Visit: Payer: Self-pay | Admitting: Family Medicine

## 2013-12-27 ENCOUNTER — Ambulatory Visit (INDEPENDENT_AMBULATORY_CARE_PROVIDER_SITE_OTHER): Payer: BC Managed Care – PPO | Admitting: Gynecology

## 2013-12-27 ENCOUNTER — Encounter: Payer: Self-pay | Admitting: Gynecology

## 2013-12-27 DIAGNOSIS — N921 Excessive and frequent menstruation with irregular cycle: Secondary | ICD-10-CM

## 2013-12-27 MED ORDER — MEGESTROL ACETATE 20 MG PO TABS: 20.0000 mg | ORAL_TABLET | Freq: Every day | ORAL | Status: DC

## 2013-12-27 NOTE — Progress Notes (Signed)
Gwendolyn Weaver 1960/10/25 [id number]        53 y.o.  G2P1001 presents complaining of heavy bleeding. Patient has a long history of irregular heavy menses requiring oral contraceptives despite having a BTL for control. Ultimately stopped these and had more irregular menses every 2-3 months but then started bleeding heavily requiring Megace for all. She ultimately had a hysteroscopy D&C with a polypoid endometrial diffuse pattern 09/2011 with pathology showing benign endometrial polyps.  Had several episodes of bleeding afterwards requiring Megace to control this. She now started bleeding again with passage of clots over the last several days.  She did have an [facility] of 67 June 2015.  Ultrasound in June showed a generous uterus with multiple small myomas.  Past medical history,surgical history, problem list, medications, allergies, family history and social history were all reviewed and documented in the EPIC chart.  Directed ROS with pertinent positives and negatives documented in the history of present illness/assessment and plan.  Exam: Kim assistant General appearance:  Normal Abdomen soft nontender without masses guarding rebound. Pelvic external BUS vagina with menses bleeding and clots. Cervix grossly normal. Uterus generous in size midline mobile nontender. Adnexa without masses or tenderness   Assessment/Plan:  53 y.o. G2P1001 with continued irregular bleeding initially using birth control pills for suppression then intermittent Megace hysteroscopy D&C recently showed a polypoid pattern. Has multiple small myomas and has continued to bleed irregularly afterwards. Options for management recognizing she is in the perimenopausal time frame include continued progesterone suppression such as Megace or Aygestin, reinitiation of low-dose birth control pills accepting the risks of thrombosis, Depo-Lupron suppression, Mirena IUD, endometrial ablation, hysterectomy. Patient actually has been thinking about  hysterectomy as the definitive treatment.  She is status post cesarean section 2. Uterus does feel mobile. Options for LAVH trial accepting fallback on TAH if unsuccessful reviewed. The larger incision with longer recovery discussed. The ovarian conservation issue was also discussed with her in the perimenopause and the options to keep both ovaries for a more smooth transition to menopause accepting the long-term risks of ovarian conservation to include benign and malignant disease in the future versus removing her ovaries now and accepting the possibility for ERT. I did discuss the WHI study and risks benefits of ERT to include stroke heart attack DVT and breast cancer. Patients can think about the ovarian conservation issue was to proceed with scheduling her surgery.     Anastasio Auerbach MD, 2:53 PM 12/27/2013

## 2013-12-27 NOTE — Patient Instructions (Addendum)
Office will call you to arrange surgery. Start on the Megace and continue with daily through the surgery. Continue on the multivitamin with iron.

## 2014-02-06 ENCOUNTER — Encounter (HOSPITAL_COMMUNITY): Payer: Self-pay

## 2014-02-06 ENCOUNTER — Encounter (HOSPITAL_COMMUNITY)
Admission: RE | Admit: 2014-02-06 | Discharge: 2014-02-06 | Disposition: A | Discharge status: Home / Self Care | Payer: BLUE CROSS/BLUE SHIELD | Source: Ambulatory Visit | Attending: Gynecology | Admitting: Gynecology

## 2014-02-06 ENCOUNTER — Other Ambulatory Visit: Payer: Self-pay | Admitting: Gynecology

## 2014-02-06 DIAGNOSIS — Z01818 Encounter for other preprocedural examination: Secondary | ICD-10-CM | POA: Diagnosis not present

## 2014-02-06 DIAGNOSIS — D219 Benign neoplasm of connective and other soft tissue, unspecified: Secondary | ICD-10-CM | POA: Diagnosis not present

## 2014-02-06 DIAGNOSIS — N926 Irregular menstruation, unspecified: Secondary | ICD-10-CM | POA: Insufficient documentation

## 2014-02-06 DIAGNOSIS — N920 Excessive and frequent menstruation with regular cycle: Secondary | ICD-10-CM | POA: Diagnosis not present

## 2014-02-06 LAB — COMPREHENSIVE METABOLIC PANEL
ALBUMIN: 4.1 g/dL (ref 3.5–5.2)
ALT: 16 U/L (ref 0–35)
ANION GAP: 5 (ref 5–15)
AST: 19 U/L (ref 0–37)
Alkaline Phosphatase: 87 U/L (ref 39–117)
BUN: 14 mg/dL (ref 6–23)
CO2: 25 mmol/L (ref 19–32)
Calcium: 9.9 mg/dL (ref 8.4–10.5)
Chloride: 110 mEq/L (ref 96–112)
Creatinine, Ser: 1.1 mg/dL (ref 0.50–1.10)
GFR calc non Af Amer: 55 mL/min — ABNORMAL LOW (ref >90)
GFR, EST AFRICAN AMERICAN: 64 mL/min — AB (ref >90)
Glucose, Bld: 98 mg/dL (ref 70–99)
POTASSIUM: 3.5 mmol/L (ref 3.5–5.1)
SODIUM: 140 mmol/L (ref 135–145)
Total Bilirubin: 0.8 mg/dL (ref 0.3–1.2)
Total Protein: 7.7 g/dL (ref 6.0–8.3)

## 2014-02-06 LAB — CBC
HEMATOCRIT: 37.1 % (ref 36.0–46.0)
Hemoglobin: 12.5 g/dL (ref 12.0–15.0)
MCH: 29.6 pg (ref 26.0–34.0)
MCHC: 33.7 g/dL (ref 30.0–36.0)
MCV: 87.7 fL (ref 78.0–100.0)
Platelets: 275 10*3/uL (ref 150–400)
RBC: 4.23 MIL/uL (ref 3.87–5.11)
RDW: 13.9 % (ref 11.5–15.5)
WBC: 5 10*3/uL (ref 4.0–10.5)

## 2014-02-06 MED ORDER — POTASSIUM CHLORIDE CRYS ER 20 MEQ PO TBCR: 20.0000 meq | EXTENDED_RELEASE_TABLET | Freq: Every day | ORAL | Status: DC

## 2014-02-06 NOTE — Patient Instructions (Addendum)
   Your procedure is scheduled on: JAN 19 AT 730AM  Enter through the Main Entrance of [facility] at: 6AM Pick up the phone at the desk and dial [phone] and inform us of your arrival.  Please call this number if you have any problems the morning of surgery: [phone]  Remember: Do not eat OR drink after midnight: JAN 18  Take these medicines the morning of surgery with a SIP OF WATER: PLEASE TAKE PRILOSEC   Do not wear jewelry, make-up, or FINGER nail polish No metal in your hair or on your body. Do not wear lotions, powders, perfumes.  You may wear deodorant.  Do not bring valuables to the hospital. Contacts, dentures or bridgework may not be worn into surgery.  Leave suitcase in the car. After Surgery it may be brought to your room. For patients being admitted to the hospital, checkout time is 11:00am the day of discharge.    Marland Kitchen

## 2014-02-07 ENCOUNTER — Ambulatory Visit (INDEPENDENT_AMBULATORY_CARE_PROVIDER_SITE_OTHER): Payer: BLUE CROSS/BLUE SHIELD | Admitting: Gynecology

## 2014-02-07 ENCOUNTER — Encounter: Payer: Self-pay | Admitting: Gynecology

## 2014-02-07 DIAGNOSIS — N921 Excessive and frequent menstruation with irregular cycle: Secondary | ICD-10-CM

## 2014-02-07 NOTE — Patient Instructions (Signed)
Followup for surgery as scheduled. 

## 2014-02-07 NOTE — H&P (Signed)
Gwendolyn Weaver 09-03-1960 [id number]   History and Physical  Chief complaint: menorrhagia, irregular menses, history endometrial polyps  History of present illness: 54 y.o. G2P1001 with ongoing heavy intermittent vaginal bleeding requiring repeated doses of Megace to control. History of oral contraceptives for menstrual regulation despite BTL which ultimately were stopped several years ago. Then again with every 2-3 months menses until she started bleeding more heavily in spring of 2015. Underwent sonohysterogram which showed multiple small myomas and an endometrial biopsy which showed endometrial polyp despite the sonohysterogram showing an empty cavity.  She was managed expectantly with continuation of her irregular bleeding which ultimately led to a hysteroscopy D&C with a polypoid endometrial cavity and pathology showing benign endometrial polyps September 2015. She has continued to bleed on and off since then requiring repeated doses of Megace to control her heavy bleeding. [place] was 54 June 2015, although she is not having menopausal symptoms such as hot flashes or night sweats.  Options for management were reviewed to include continued attempts at progesterone suppression, reinitiation of oral contraceptives, Depo-Lupron suppression, Mirena IUD, endometrial ablation, hysterectomy. Patient wants definitive treatment and wants to proceed with hysterectomy. She is admitted for attempted LAVH BSO possible TAH/BSO. She has a history of 2 prior cesarean sections with no vaginal births.  Past medical history,surgical history, medications, allergies, family history and social history were all reviewed and documented in the EPIC chart.  ROS:  Was performed and pertinent positives and negatives are included in the history of present illness.  Exam:  Kim assistant General: well developed, well nourished female, no acute distress HEENT: normal  Lungs: clear to auscultation without wheezing, rales or  rhonchi  Cardiac: regular rate without rubs, murmurs or gallops  Abdomen: soft, nontender without masses, guarding, rebound, organomegaly. Well-healed midline incision with panniculus scarring and retraction. Pelvic: external bus vagina: normal   Cervix: grossly normal  Uterus: generous in size, midline mobile nontender  Adnexa: without masses or tenderness    Assessment/Plan:  54 y.o. G2P1001 with history as above. Patient admitted for attempted LAVH BSO. She understands that at any time during the procedure if I feel it is unsafe to proceed laparoscopically or complications arise that I will switch to a total abdominal hysterectomy with a larger incision and a longer recovery period. The ovarian conservation issue was reviewed with her and the options to keep both ovaries for a more gradual transition through menopause versus removing both ovaries discussed. The risks of ovarian retention to include benign ovarian disease as well as ovarian cancer in the future versus the risk of removing her ovaries and acceleration of menopause both from a symptom standpoint as well as from a bone loss/cardiovascular risk standpoint also discussed. Patient wants to have both ovaries removed and she accepts the risk of accelerated menopause. We did discuss possible ERT following the surgery if she does become symptomatic and I reviewed the risks to include the WHI study with increased risk of stroke, heart attack, DVT and breast cancer. I discussed the expected intraoperative and postoperative courses as well as the recovery period. The risks of infection requiring prolonged antibiotics or reoperation for abscess/hematoma drainage reviewed. The risks of incisional complications such as hematoma/seroma or infection requiring opening and draining of incisions and closure by secondary intention. Long-term issues such as keloid/cosmetics and hernia formation discussed. She did have a wound separation after her second  C-section requiring closure by secondary intention and this has accounted for the puckering and drawing up of her lower  aspect of the midline incision. If we do need to proceed with a TAH, the way that her panniculus and scarring is I think the most prudent course would be to go through a repeat midline incision as a Pfannenstiel on top of the scarring would lead not only to a less than optimum cosmetic result but also difficulty in closure at the cross between the 2 incision lines. Patient understands this and accepts this.  The risks of hemorrhage necessitating transfusion and the risks of transfusion to include transfusion reaction, hepatitis, HIV, mad cow disease and other unknown entities were reviewed. The risk of inadvertent injury to internal organs including bowel, bladder, ureters, vessels, nerves either immediately recognized or delay recognized necessitating major exploratory reparative surgeries and future reparative surgeries including ostomy formation' bowel resection' bladder repair' ureteral damage repair was all discussed with her. Absolute and irreversible sterility associated with hysterectomy was discussed. Sexuality following hysterectomy was also reviewed and the potential for orgasmic dysfunction and persistent dyspareunia was discussed. The patient's questions were answered to her satisfaction and she is ready to proceed with surgery.    Anastasio Auerbach MD, 3:30 PM 02/07/2014

## 2014-02-07 NOTE — Progress Notes (Signed)
Gwendolyn Weaver 11-03-1960 [id number]   Preoperative consult  Chief complaint: menorrhagia, irregular menses, history endometrial polyps  History of present illness: 54 y.o. G2P1001 with ongoing heavy intermittent vaginal bleeding requiring repeated doses of Megace to control. History of oral contraceptives for menstrual regulation despite BTL which ultimately were stopped several years ago. Then again with every 2-3 months menses until she started bleeding more heavily in spring of 2015. Underwent sonohysterogram which showed multiple small myomas and an endometrial biopsy which showed endometrial polyp despite the sonohysterogram showing an empty cavity.  She was managed expectantly with continuation of her irregular bleeding which ultimately led to a hysteroscopy D&C with a polypoid endometrial cavity and pathology showing benign endometrial polyps September 2015. She has continued to bleed on and off since then requiring repeated doses of Megace to control her heavy bleeding. [place] was 54 June 2015, although she is not having menopausal symptoms such as hot flashes or night sweats.  Options for management were reviewed to include continued attempts at progesterone suppression, reinitiation of oral contraceptives, Depo-Lupron suppression, Mirena IUD, endometrial ablation, hysterectomy. Patient wants definitive treatment and wants to proceed with hysterectomy. She is admitted for attempted LAVH BSO possible TAH/BSO. She has a history of 2 prior cesarean sections with no vaginal births.  Past medical history,surgical history, medications, allergies, family history and social history were all reviewed and documented in the EPIC chart.  ROS:  Was performed and pertinent positives and negatives are included in the history of present illness.  Exam:  Kim assistant General: well developed, well nourished female, no acute distress HEENT: normal  Lungs: clear to auscultation without wheezing, rales or rhonchi   Cardiac: regular rate without rubs, murmurs or gallops  Abdomen: soft, nontender without masses, guarding, rebound, organomegaly. Well-healed midline incision with panniculus scarring and retraction. Pelvic: external bus vagina: normal   Cervix: grossly normal  Uterus: generous in size, midline mobile nontender  Adnexa: without masses or tenderness    Assessment/Plan:  54 y.o. G2P1001 with history as above. Patient admitted for attempted LAVH BSO. She understands that at any time during the procedure if I feel it is unsafe to proceed laparoscopically or complications arise that I will switch to a total abdominal hysterectomy with a larger incision and a longer recovery period. The ovarian conservation issue was reviewed with her and the options to keep both ovaries for a more gradual transition through menopause versus removing both ovaries discussed. The risks of ovarian retention to include benign ovarian disease as well as ovarian cancer in the future versus the risk of removing her ovaries and acceleration of menopause both from a symptom standpoint as well as from a bone loss/cardiovascular risk standpoint also discussed. Patient wants to have both ovaries removed and she accepts the risk of accelerated menopause. We did discuss possible ERT following the surgery if she does become symptomatic and I reviewed the risks to include the WHI study with increased risk of stroke, heart attack, DVT and breast cancer. I discussed the expected intraoperative and postoperative courses as well as the recovery period. The risks of infection requiring prolonged antibiotics or reoperation for abscess/hematoma drainage reviewed. The risks of incisional complications such as hematoma/seroma or infection requiring opening and draining of incisions and closure by secondary intention. Long-term issues such as keloid/cosmetics and hernia formation discussed. She did have a wound separation after her second C-section  requiring closure by secondary intention and this has accounted for the puckering and drawing up of her lower aspect  of the midline incision. If we do need to proceed with a TAH, the way that her panniculus and scarring is I think the most prudent course would be to go through a repeat midline incision as a Pfannenstiel on top of the scarring would lead not only to a less than optimum cosmetic result but also difficulty in closure at the cross between the 2 incision lines. Patient understands this and accepts this.  The risks of hemorrhage necessitating transfusion and the risks of transfusion to include transfusion reaction, hepatitis, HIV, mad cow disease and other unknown entities were reviewed. The risk of inadvertent injury to internal organs including bowel, bladder, ureters, vessels, nerves either immediately recognized or delay recognized necessitating major exploratory reparative surgeries and future reparative surgeries including ostomy formation' bowel resection' bladder repair' ureteral damage repair was all discussed with her. Absolute and irreversible sterility associated with hysterectomy was discussed. Sexuality following hysterectomy was also reviewed and the potential for orgasmic dysfunction and persistent dyspareunia was discussed. The patient's questions were answered to her satisfaction and she is ready to proceed with surgery.    Anastasio Auerbach MD, 3:13 PM 02/07/2014

## 2014-02-08 DIAGNOSIS — Z0289 Encounter for other administrative examinations: Secondary | ICD-10-CM

## 2014-02-12 MED ORDER — CEFOTETAN DISODIUM 2 G IJ SOLR
2.0000 g | INTRAMUSCULAR | Status: AC
  Administered 2014-02-13: 2 g via INTRAVENOUS
  Filled 2014-02-12: qty 2

## 2014-02-12 NOTE — Anesthesia Preprocedure Evaluation (Addendum)
Anesthesia Evaluation  Patient identified by MRN, date of birth, ID band Patient awake    Reviewed: Allergy & Precautions, NPO status , Patient's Chart, lab work & pertinent test results  History of Anesthesia Complications (+) history of anesthetic complications (voiding issues after anesthesia)  Airway Mallampati: III  TM Distance: >3 FB Neck ROM: Full    Dental no notable dental hx. (+) Dental Advisory Given, Partial Upper   Pulmonary  breath sounds clear to auscultation  Pulmonary exam normal       Cardiovascular negative cardio ROS  + Valvular Problems/Murmurs Rhythm:Regular Rate:Normal     Neuro/Psych negative neurological ROS  negative psych ROS   GI/Hepatic Neg liver ROS, GERD-  Medicated and Controlled,  Endo/Other  negative endocrine ROS  Renal/GU negative Renal ROS  negative genitourinary   Musculoskeletal negative musculoskeletal ROS (+)   Abdominal   Peds negative pediatric ROS (+)  Hematology negative hematology ROS (+)   Anesthesia Other Findings   Reproductive/Obstetrics negative OB ROS                            Anesthesia Physical Anesthesia Plan  ASA: II  Anesthesia Plan: General   Post-op Pain Management:    Induction: Intravenous  Airway Management Planned: Oral ETT  Additional Equipment:   Intra-op Plan:   Post-operative Plan: Extubation in OR  Informed Consent: I have reviewed the patients History and Physical, chart, labs and discussed the procedure including the risks, benefits and alternatives for the proposed anesthesia with the patient or authorized representative who has indicated his/her understanding and acceptance.   Dental advisory given  Plan Discussed with: CRNA  Anesthesia Plan Comments:         Anesthesia Quick Evaluation

## 2014-02-13 ENCOUNTER — Ambulatory Visit (HOSPITAL_COMMUNITY): Payer: BLUE CROSS/BLUE SHIELD | Admitting: Anesthesiology

## 2014-02-13 ENCOUNTER — Ambulatory Visit (HOSPITAL_COMMUNITY)
Admission: RE | Admit: 2014-02-13 | Discharge: 2014-02-14 | Disposition: A | Discharge status: Home / Self Care | Payer: BLUE CROSS/BLUE SHIELD | Source: Ambulatory Visit | Attending: Gynecology | Admitting: Gynecology

## 2014-02-13 ENCOUNTER — Encounter (HOSPITAL_COMMUNITY)
Admission: RE | Disposition: A | Discharge status: Home / Self Care | Payer: Self-pay | Source: Ambulatory Visit | Attending: Gynecology

## 2014-02-13 ENCOUNTER — Encounter (HOSPITAL_COMMUNITY): Payer: Self-pay | Admitting: Anesthesiology

## 2014-02-13 DIAGNOSIS — D219 Benign neoplasm of connective and other soft tissue, unspecified: Secondary | ICD-10-CM | POA: Diagnosis present

## 2014-02-13 DIAGNOSIS — N920 Excessive and frequent menstruation with regular cycle: Secondary | ICD-10-CM | POA: Insufficient documentation

## 2014-02-13 DIAGNOSIS — N800 Endometriosis of uterus: Secondary | ICD-10-CM | POA: Diagnosis not present

## 2014-02-13 DIAGNOSIS — K660 Peritoneal adhesions (postprocedural) (postinfection): Secondary | ICD-10-CM | POA: Diagnosis not present

## 2014-02-13 DIAGNOSIS — N921 Excessive and frequent menstruation with irregular cycle: Secondary | ICD-10-CM

## 2014-02-13 DIAGNOSIS — N926 Irregular menstruation, unspecified: Principal | ICD-10-CM | POA: Insufficient documentation

## 2014-02-13 DIAGNOSIS — N925 Other specified irregular menstruation: Secondary | ICD-10-CM

## 2014-02-13 DIAGNOSIS — D252 Subserosal leiomyoma of uterus: Secondary | ICD-10-CM | POA: Diagnosis not present

## 2014-02-13 DIAGNOSIS — K219 Gastro-esophageal reflux disease without esophagitis: Secondary | ICD-10-CM | POA: Diagnosis not present

## 2014-02-13 DIAGNOSIS — D259 Leiomyoma of uterus, unspecified: Secondary | ICD-10-CM

## 2014-02-13 HISTORY — PX: LAPAROSCOPIC BILATERAL SALPINGO OOPHERECTOMY: SHX5890

## 2014-02-13 HISTORY — PX: LAPAROSCOPIC ASSISTED VAGINAL HYSTERECTOMY: SHX5398

## 2014-02-13 HISTORY — PX: CYSTOSCOPY: SHX5120

## 2014-02-13 LAB — PREGNANCY, URINE: PREG TEST UR: NEGATIVE

## 2014-02-13 SURGERY — HYSTERECTOMY, VAGINAL, LAPAROSCOPY-ASSISTED
Anesthesia: General | Site: Bladder

## 2014-02-13 MED ORDER — PROPOFOL 10 MG/ML IV BOLUS
INTRAVENOUS | Status: DC | PRN
  Administered 2014-02-13: 180 mg via INTRAVENOUS

## 2014-02-13 MED ORDER — MIDAZOLAM HCL 2 MG/2ML IJ SOLN
INTRAMUSCULAR | Status: DC | PRN
  Administered 2014-02-13: 2 mg via INTRAVENOUS

## 2014-02-13 MED ORDER — 0.9 % SODIUM CHLORIDE (POUR BTL) OPTIME
TOPICAL | Status: DC | PRN
  Administered 2014-02-13: 1000 mL

## 2014-02-13 MED ORDER — NEOSTIGMINE METHYLSULFATE 10 MG/10ML IV SOLN
INTRAVENOUS | Status: DC | PRN
  Administered 2014-02-13: 3 mg via INTRAVENOUS

## 2014-02-13 MED ORDER — DEXAMETHASONE SODIUM PHOSPHATE 4 MG/ML IJ SOLN
INTRAMUSCULAR | Status: AC
  Filled 2014-02-13: qty 1

## 2014-02-13 MED ORDER — HYDROMORPHONE HCL 1 MG/ML IJ SOLN
0.2500 mg | INTRAMUSCULAR | Status: DC | PRN
  Administered 2014-02-13 (×4): 0.5 mg via INTRAVENOUS

## 2014-02-13 MED ORDER — FENTANYL CITRATE 0.05 MG/ML IJ SOLN
INTRAMUSCULAR | Status: AC
  Filled 2014-02-13: qty 2

## 2014-02-13 MED ORDER — LIDOCAINE-EPINEPHRINE 1 %-1:100000 IJ SOLN
INTRAMUSCULAR | Status: DC | PRN
  Administered 2014-02-13: 10 mL

## 2014-02-13 MED ORDER — HYDROMORPHONE HCL 1 MG/ML IJ SOLN
INTRAMUSCULAR | Status: AC
Start: 2014-02-13 — End: 2014-02-13
  Filled 2014-02-13: qty 1

## 2014-02-13 MED ORDER — HYDROMORPHONE HCL 1 MG/ML IJ SOLN
INTRAMUSCULAR | Status: AC
  Filled 2014-02-13: qty 1

## 2014-02-13 MED ORDER — ACETAMINOPHEN 10 MG/ML IV SOLN
INTRAVENOUS | Status: DC | PRN
  Administered 2014-02-13: 1000 mg via INTRAVENOUS

## 2014-02-13 MED ORDER — MORPHINE SULFATE 4 MG/ML IJ SOLN: 1.0000 mg | INTRAMUSCULAR | Status: DC | PRN

## 2014-02-13 MED ORDER — BUPIVACAINE HCL (PF) 0.25 % IJ SOLN
INTRAMUSCULAR | Status: DC | PRN
  Administered 2014-02-13: 6 mL

## 2014-02-13 MED ORDER — ONDANSETRON HCL 4 MG/2ML IJ SOLN
INTRAMUSCULAR | Status: AC
  Filled 2014-02-13: qty 2

## 2014-02-13 MED ORDER — METHYLENE BLUE 1 % INJ SOLN
INTRAMUSCULAR | Status: DC | PRN
  Administered 2014-02-13: 100 mg via INTRAVENOUS

## 2014-02-13 MED ORDER — SCOPOLAMINE 1 MG/3DAYS TD PT72
1.0000 | MEDICATED_PATCH | Freq: Once | TRANSDERMAL | Status: DC
  Administered 2014-02-13: 1.5 mg via TRANSDERMAL

## 2014-02-13 MED ORDER — HEPARIN SODIUM (PORCINE) 5000 UNIT/ML IJ SOLN
INTRAMUSCULAR | Status: AC
  Filled 2014-02-13: qty 1

## 2014-02-13 MED ORDER — DEXAMETHASONE SODIUM PHOSPHATE 10 MG/ML IJ SOLN
INTRAMUSCULAR | Status: DC | PRN
  Administered 2014-02-13: 4 mg via INTRAVENOUS

## 2014-02-13 MED ORDER — ONDANSETRON HCL 4 MG/2ML IJ SOLN: 4.0000 mg | Freq: Once | INTRAMUSCULAR | Status: DC | PRN

## 2014-02-13 MED ORDER — ACETAMINOPHEN 160 MG/5ML PO SOLN: 325.0000 mg | ORAL | Status: DC | PRN

## 2014-02-13 MED ORDER — STERILE WATER FOR IRRIGATION IR SOLN
Status: DC | PRN
  Administered 2014-02-13: 1000 mL via INTRAVESICAL

## 2014-02-13 MED ORDER — METHYLENE BLUE 1 % INJ SOLN
INTRAMUSCULAR | Status: AC
  Filled 2014-02-13: qty 10

## 2014-02-13 MED ORDER — KETOROLAC TROMETHAMINE 30 MG/ML IJ SOLN: 30.0000 mg | Freq: Four times a day (QID) | INTRAMUSCULAR | Status: DC

## 2014-02-13 MED ORDER — ACETAMINOPHEN 10 MG/ML IV SOLN
1000.0000 mg | Freq: Once | INTRAVENOUS | Status: DC
  Filled 2014-02-13: qty 100

## 2014-02-13 MED ORDER — ONDANSETRON HCL 4 MG/2ML IJ SOLN: 4.0000 mg | Freq: Four times a day (QID) | INTRAMUSCULAR | Status: DC | PRN

## 2014-02-13 MED ORDER — GLYCOPYRROLATE 0.2 MG/ML IJ SOLN
INTRAMUSCULAR | Status: DC | PRN
  Administered 2014-02-13: 0.6 mg via INTRAVENOUS

## 2014-02-13 MED ORDER — LIDOCAINE HCL (CARDIAC) 20 MG/ML IV SOLN
INTRAVENOUS | Status: AC
  Filled 2014-02-13: qty 5

## 2014-02-13 MED ORDER — ACETAMINOPHEN 325 MG PO TABS: 325.0000 mg | ORAL_TABLET | ORAL | Status: DC | PRN

## 2014-02-13 MED ORDER — ROCURONIUM BROMIDE 100 MG/10ML IV SOLN
INTRAVENOUS | Status: AC
  Filled 2014-02-13: qty 1

## 2014-02-13 MED ORDER — KETOROLAC TROMETHAMINE 30 MG/ML IJ SOLN
30.0000 mg | Freq: Four times a day (QID) | INTRAMUSCULAR | Status: DC
  Administered 2014-02-13 – 2014-02-14 (×4): 30 mg via INTRAVENOUS
  Filled 2014-02-13 (×4): qty 1

## 2014-02-13 MED ORDER — BUPIVACAINE HCL (PF) 0.25 % IJ SOLN
INTRAMUSCULAR | Status: AC
Start: 2014-02-13 — End: 2014-02-13
  Filled 2014-02-13: qty 30

## 2014-02-13 MED ORDER — PANTOPRAZOLE SODIUM 40 MG PO TBEC: 40.0000 mg | DELAYED_RELEASE_TABLET | Freq: Every day | ORAL | Status: DC

## 2014-02-13 MED ORDER — ONDANSETRON HCL 4 MG PO TABS: 4.0000 mg | ORAL_TABLET | Freq: Four times a day (QID) | ORAL | Status: DC | PRN

## 2014-02-13 MED ORDER — GLYCOPYRROLATE 0.2 MG/ML IJ SOLN
INTRAMUSCULAR | Status: AC
  Filled 2014-02-13: qty 3

## 2014-02-13 MED ORDER — ROCURONIUM BROMIDE 100 MG/10ML IV SOLN
INTRAVENOUS | Status: DC | PRN
  Administered 2014-02-13: 5 mg via INTRAVENOUS
  Administered 2014-02-13: 45 mg via INTRAVENOUS

## 2014-02-13 MED ORDER — OXYCODONE-ACETAMINOPHEN 5-325 MG PO TABS: 1.0000 | ORAL_TABLET | ORAL | Status: DC | PRN

## 2014-02-13 MED ORDER — LACTATED RINGERS IV SOLN
INTRAVENOUS | Status: DC
  Administered 2014-02-13 (×2): via INTRAVENOUS

## 2014-02-13 MED ORDER — LIDOCAINE-EPINEPHRINE 1 %-1:100000 IJ SOLN
INTRAMUSCULAR | Status: AC
  Filled 2014-02-13: qty 1

## 2014-02-13 MED ORDER — PROPOFOL 10 MG/ML IV BOLUS
INTRAVENOUS | Status: AC
  Filled 2014-02-13: qty 20

## 2014-02-13 MED ORDER — ONDANSETRON HCL 4 MG/2ML IJ SOLN
INTRAMUSCULAR | Status: DC | PRN
  Administered 2014-02-13: 4 mg via INTRAVENOUS

## 2014-02-13 MED ORDER — LACTATED RINGERS IR SOLN
Status: DC | PRN
  Administered 2014-02-13: 3000 mL

## 2014-02-13 MED ORDER — DEXTROSE-NACL 5-0.2 % IV SOLN
INTRAVENOUS | Status: DC
  Administered 2014-02-13 – 2014-02-14 (×2): via INTRAVENOUS

## 2014-02-13 MED ORDER — FENTANYL CITRATE 0.05 MG/ML IJ SOLN
INTRAMUSCULAR | Status: DC | PRN
  Administered 2014-02-13 (×4): 50 ug via INTRAVENOUS
  Administered 2014-02-13 (×2): 25 ug via INTRAVENOUS

## 2014-02-13 MED ORDER — FENTANYL CITRATE 0.05 MG/ML IJ SOLN
INTRAMUSCULAR | Status: AC
  Filled 2014-02-13: qty 5

## 2014-02-13 MED ORDER — PHENYLEPHRINE HCL 10 MG/ML IJ SOLN
INTRAMUSCULAR | Status: DC | PRN
  Administered 2014-02-13: 80 ug via INTRAVENOUS

## 2014-02-13 MED ORDER — SCOPOLAMINE 1 MG/3DAYS TD PT72
MEDICATED_PATCH | TRANSDERMAL | Status: AC
Start: 2014-02-13 — End: 2014-02-13
  Filled 2014-02-13: qty 1

## 2014-02-13 MED ORDER — KETOROLAC TROMETHAMINE 30 MG/ML IJ SOLN
INTRAMUSCULAR | Status: AC
  Filled 2014-02-13: qty 2

## 2014-02-13 MED ORDER — DIPHENHYDRAMINE HCL 25 MG PO CAPS: 50.0000 mg | ORAL_CAPSULE | Freq: Four times a day (QID) | ORAL | Status: DC | PRN

## 2014-02-13 MED ORDER — LIDOCAINE HCL (CARDIAC) 20 MG/ML IV SOLN
INTRAVENOUS | Status: DC | PRN
  Administered 2014-02-13: 30 mg via INTRAVENOUS
  Administered 2014-02-13: 70 mg via INTRAVENOUS

## 2014-02-13 MED ORDER — PHENYLEPHRINE 40 MCG/ML (10ML) SYRINGE FOR IV PUSH (FOR BLOOD PRESSURE SUPPORT)
PREFILLED_SYRINGE | INTRAVENOUS | Status: AC
  Filled 2014-02-13: qty 10

## 2014-02-13 MED ORDER — MIDAZOLAM HCL 2 MG/2ML IJ SOLN
INTRAMUSCULAR | Status: AC
  Filled 2014-02-13: qty 2

## 2014-02-13 SURGICAL SUPPLY — 65 items
BAG SPEC RTRVL LRG 6X4 10 (ENDOMECHANICALS)
BARRIER ADHS 3X4 INTERCEED (GAUZE/BANDAGES/DRESSINGS) IMPLANT
BLADE SURG 10 STRL SS (BLADE) ×4 IMPLANT
BLADE SURG 11 STRL SS (BLADE) ×8 IMPLANT
BLADE SURG 15 STRL LF C SS BP (BLADE) ×3 IMPLANT
BLADE SURG 15 STRL SS (BLADE) ×4
BRR ADH 4X3 ABS CNTRL BYND (GAUZE/BANDAGES/DRESSINGS)
CABLE HIGH FREQUENCY MONO STRZ (ELECTRODE) ×1 IMPLANT
CATH ROBINSON RED A/P 16FR (CATHETERS) ×4 IMPLANT
CLOTH BEACON ORANGE TIMEOUT ST (SAFETY) ×4 IMPLANT
CONT PATH 16OZ SNAP LID 3702 (MISCELLANEOUS) ×4 IMPLANT
COVER BACK TABLE 60X90IN (DRAPES) ×4 IMPLANT
COVER MAYO STAND STRL (DRAPES) ×4 IMPLANT
DECANTER SPIKE VIAL GLASS SM (MISCELLANEOUS) ×8 IMPLANT
DRSG COVADERM PLUS 2X2 (GAUZE/BANDAGES/DRESSINGS) ×6 IMPLANT
DRSG OPSITE POSTOP 3X4 (GAUZE/BANDAGES/DRESSINGS) ×4 IMPLANT
DURAPREP 26ML APPLICATOR (WOUND CARE) ×4 IMPLANT
ELECT REM PT RETURN 9FT ADLT (ELECTROSURGICAL) ×4
ELECTRODE REM PT RTRN 9FT ADLT (ELECTROSURGICAL) ×3 IMPLANT
EVACUATOR SMOKE 8.L (FILTER) ×3 IMPLANT
FILTER SMOKE EVAC LAPAROSHD (FILTER) ×4 IMPLANT
GLOVE BIO SURGEON STRL SZ7.5 (GLOVE) ×14 IMPLANT
GLOVE BIOGEL PI IND STRL 7.0 (GLOVE) ×6 IMPLANT
GLOVE BIOGEL PI IND STRL 7.5 (GLOVE) IMPLANT
GLOVE BIOGEL PI IND STRL 8 (GLOVE) IMPLANT
GLOVE BIOGEL PI INDICATOR 7.0 (GLOVE) ×3
GLOVE BIOGEL PI INDICATOR 7.5 (GLOVE) ×1
GLOVE BIOGEL PI INDICATOR 8 (GLOVE) ×2
GLOVE ECLIPSE 7.0 STRL STRAW (GLOVE) ×1 IMPLANT
GLOVE ECLIPSE 7.5 STRL STRAW (GLOVE) ×1 IMPLANT
GOWN STRL REUS W/ TWL LRG LVL3 (GOWN DISPOSABLE) ×21 IMPLANT
GOWN STRL REUS W/TWL LRG LVL3 (GOWN DISPOSABLE) ×28 IMPLANT
LIQUID BAND (GAUZE/BANDAGES/DRESSINGS) ×1 IMPLANT
NEEDLE MAYO .5 CIRCLE (NEEDLE) IMPLANT
NS IRRIG 1000ML POUR BTL (IV SOLUTION) ×4 IMPLANT
PACK LAPAROSCOPY BASIN (CUSTOM PROCEDURE TRAY) ×4 IMPLANT
PACK LAVH (CUSTOM PROCEDURE TRAY) ×4 IMPLANT
PAD OB MATERNITY 4.3X12.25 (PERSONAL CARE ITEMS) ×4 IMPLANT
PAD POSITIONER PINK NONSTERILE (MISCELLANEOUS) ×4 IMPLANT
POUCH SPECIMEN RETRIEVAL 10MM (ENDOMECHANICALS) ×3 IMPLANT
PROTECTOR NERVE ULNAR (MISCELLANEOUS) ×8 IMPLANT
SCISSORS LAP 5X35 DISP (ENDOMECHANICALS) IMPLANT
SET CYSTO W/LG BORE CLAMP LF (SET/KITS/TRAYS/PACK) IMPLANT
SET IRRIG TUBING LAPAROSCOPIC (IRRIGATION / IRRIGATOR) ×4 IMPLANT
SHEARS HARMONIC ACE PLUS 36CM (ENDOMECHANICALS) ×4 IMPLANT
SLEEVE XCEL OPT CAN 5 100 (ENDOMECHANICALS) IMPLANT
SOLUTION ELECTROLUBE (MISCELLANEOUS) ×1 IMPLANT
STRIP CLOSURE SKIN 1/4X3 (GAUZE/BANDAGES/DRESSINGS) IMPLANT
SUT PLAIN 4 0 FS 2 27 (SUTURE) ×4 IMPLANT
SUT VIC AB 0 CT1 18XCR BRD8 (SUTURE) ×9 IMPLANT
SUT VIC AB 0 CT1 27 (SUTURE) ×4
SUT VIC AB 0 CT1 27XBRD ANBCTR (SUTURE) IMPLANT
SUT VIC AB 0 CT1 36 (SUTURE) ×1 IMPLANT
SUT VIC AB 0 CT1 8-18 (SUTURE) ×12
SUT VIC AB 2-0 SH 27 (SUTURE) ×4
SUT VIC AB 2-0 SH 27XBRD (SUTURE) ×3 IMPLANT
SUT VICRYL 0 TIES 12 18 (SUTURE) ×4 IMPLANT
SUT VICRYL 0 UR6 27IN ABS (SUTURE) ×4 IMPLANT
SYR BULB IRRIGATION 50ML (SYRINGE) ×4 IMPLANT
TOWEL OR 17X24 6PK STRL BLUE (TOWEL DISPOSABLE) ×8 IMPLANT
TRAY FOLEY CATH 14FR (SET/KITS/TRAYS/PACK) ×4 IMPLANT
TROCAR XCEL NON-BLD 11X100MML (ENDOMECHANICALS) ×4 IMPLANT
TROCAR XCEL NON-BLD 5MMX100MML (ENDOMECHANICALS) ×8 IMPLANT
WARMER LAPAROSCOPE (MISCELLANEOUS) ×4 IMPLANT
WATER STERILE IRR 1000ML POUR (IV SOLUTION) ×4 IMPLANT

## 2014-02-13 NOTE — Op Note (Signed)
Marcelia Petersen 08-04-1960 [id number]   Post Operative Note   Date of surgery:  02/13/2014  Pre Op Dx:  Menorrhagia, irregular menses, leiomyoma  Post Op Dx:  Menorrhagia, irregular menses, leiomyoma  Procedure:  Laparoscopic-assisted vaginal hysterectomy, bilateral salpingo-oophorectomy, cystoscopy  Surgeon:  Anastasio Auerbach  Assistant:  Uvaldo Rising  Anesthesia:  General  EBL:  Approximately 850 cc  Complications:  None  Specimen:  Uterus, bilateral fallopian tubes, bilateral ovaries to pathology  Findings: EUA:  External BUS vagina normal. Cervix normal. Uterus grossly normal midline mobile. Adnexa without masses   Operative:  Anterior cul-de-sac with vesicouterine peritoneal scarring consistent with her history of cesarean section. Posterior cul-de-sac normal. Uterus mildly enlarged with small subserosal leiomyoma. Right and left fallopian tubes normal length caliber and fimbriated ends. Right and left ovaries grossly normal, free and mobile. Upper abdominal exam shows appendix grossly normal free and mobile. Liver smooth without abnormalities. Gallbladder not visualized  Procedure:  The patient was taken to the operating room, underwent general anesthesia, was placed in the low dorsal lithotomy position, received an abdominal, perineal, vaginal preparation with DuraPrep and Betadine solution per nursing personnel. The timeout was performed by the surgical team. The cervix was visualized with a speculum, grasped with a single-tooth tenaculum and the Hulka tenaculum was placed without difficulty. The patient was draped in the usual fashion. An OG tube was placed to empty the stomach and due to her prior history of cesarean section 2 a left upper quadrant port entry was undertaken to assess for periumbilical adhesions. A small skin incision was made in the midclavicular line and 4 cm below the costal margin a 5 mm direct entry trocar was placed under direct visualization without  difficulty and the abdomen was subsequently insufflated. Examination of the abdomen showed no anterior abdominal wall adhesions and subsequently a 10 mm infraumbilical port was placed through a vertical incision under direct visualization without difficulty. A right suprapubic 5 mm port was also placed under direct visualization after transillumination for the vessels without difficulty. Examination of the pelvic organs and upper abdominal exam was carried out with findings noted above. The uterus was elevated and the left infundibulopelvic ligament and vessels identified. The ureter was identified away from the surgical site and using the harmonic scalpel the pedicle was transected without difficulty. The peritoneal reflections of the broad ligament were resected using the harmonic scalpel to the level of the round ligament and this again was transected without difficulty. The anterior vesico-uterine peritoneal fold was transected using the harmonic scalpel to the midline. A similar procedure was then carried out on the other side connecting the peritoneal incisions anteriorly. The bladder flap was developed without difficulty. At this point attention was then turned to the vaginal portion of the procedure. The patient was then placed in the high dorsal lithotomy position, a Foley catheter was placed, the cervix visualized with a weighted speculum, the Hulka tenaculum removed and the cervix regrasped with a single-tooth tenaculum. The cervical mucosa was circumferentially injected using 1% lidocaine with 1:100,000 epinephrine mixture, 10 cc total and the cervical mucosa was then circumferentially sharply incised. The paracervical planes were sharply developed and the posterior cul-de-sac was then sharply entered without difficulty. A long weighted speculum was then placed and the right and left uterosacral ligaments identified clamped cut and ligated using 0 Vicryl suture and tagged for future reference. The  anterior vesicouterine plane was progressively developed and the uterus was progressively freed from its attachments through clamping cutting and ligating  of the cardinal ligaments and parametrial tissues using 0 Vicryl suture without difficulty. The anterior cul-de-sac was ultimately entered and the uterine vessels bilaterally were identified clamped cut and ligated using 0 Vicryl suture. Due to the bulk of the uterus the cervix was cored from the specimen to allow collapse of the myometrium and this allowed for delivery of the uterus and clamping, cutting and ligating of the remaining attachments using 0 Vicryl suture. The long weighted speculum was replaced with a shorter weighted speculum, the intestines packed from the operative field and the cul-de-sac irrigated. The posterior vaginal cuff was run from uterosacral ligament to uterosacral ligament using 0 Vicryl suture in an running interlocking stitch. The bowel packing was removed, the pelvis again irrigated showing adequate hemostasis and the vagina was closed anterior to posterior using 0 Vicryl suture in interrupted figure-of-eight stitch. The vagina was irrigated showing adequate hemostasis, the Foley catheter was removed, the patient having previously received intravenous methylene blue without difficulty and cystoscopy was performed showing bilateral ureteral jets and normal-appearing bladder mucosa with no evidence of trauma. The Foley catheter was replaced and the patient was placed in the low dorsal lithotomy position, the surgical team re-gloved and regowned and the abdomen was reinsufflated, copiously irrigated showing adequate hemostasis of all pedicles. The gas was slowly allowed to escape and the pelvis was reinspected under low pressure situation again showing maintained hemostasis. The right and left 5 mm suprapubic ports were removed under direct visualization showing adequate hemostasis and the infraumbilical port was backed out under direct  visualization showing adequate hemostasis and no evidence of hernia formation. All skin incisions were injected using 0.25% Marcaine and the infraumbilical port was closed using 0 Vicryl suture in an interrupted subcutaneous fascial stitch. All skin incisions were closed using Liquiban skin adhesive, the patient awakened without difficulty and taken to recovery room in good condition having tolerated the procedure well.     Anastasio Auerbach MD, 11:05 AM 02/13/2014

## 2014-02-13 NOTE — Transfer of Care (Signed)
Immediate Anesthesia Transfer of Care Note  Patient: Gwendolyn Weaver  Procedure(s) Performed: Procedure(s): LAPAROSCOPIC ASSISTED VAGINAL HYSTERECTOMY (N/A) LAPAROSCOPIC BILATERAL SALPINGO OOPHORECTOMY (Bilateral)  Patient Location: PACU  Anesthesia Type:General  Level of Consciousness: awake, sedated and patient cooperative  Airway & Oxygen Therapy: Patient Spontanous Breathing and Patient connected to nasal cannula oxygen  Post-op Assessment: Report given to PACU RN and Post -op Vital signs reviewed and stable  Post vital signs: Reviewed and stable  Complications: No apparent anesthesia complications

## 2014-02-13 NOTE — Anesthesia Postprocedure Evaluation (Signed)
Anesthesia Post Note  Patient: Gwendolyn Weaver  Procedure(s) Performed: Procedure(s) (LRB): LAPAROSCOPIC ASSISTED VAGINAL HYSTERECTOMY (N/A) LAPAROSCOPIC BILATERAL SALPINGO OOPHORECTOMY (Bilateral) CYSTOSCOPY (N/A)  Anesthesia type: GA  Patient location: PACU  Post pain: Pain level controlled  Post assessment: Post-op Vital signs reviewed  Last Vitals:  Filed Vitals:   02/13/14 1030  BP: 102/53  Pulse: 69  Temp:   Resp: 21    Post vital signs: Reviewed  Level of consciousness: sedated  Complications: No apparent anesthesia complications

## 2014-02-13 NOTE — H&P (Signed)
  The patient was examined.  I reviewed the proposed surgery and consent form with the patient.  The dictated history and physical is current and accurate and all questions were answered. The patient is ready to proceed with surgery and has a realistic understanding and expectation for the outcome.   Anastasio Auerbach MD, 7:14 AM 02/13/2014

## 2014-02-13 NOTE — Anesthesia Procedure Notes (Addendum)
Procedure Name: Intubation Date/Time: 02/13/2014 7:34 AM Performed by: Tobin Chad Pre-anesthesia Checklist: Patient identified, Timeout performed, Emergency Drugs available, Suction available and Patient being monitored Patient Re-evaluated:Patient Re-evaluated prior to inductionOxygen Delivery Method: Circle system utilized Preoxygenation: Pre-oxygenation with 100% oxygen Intubation Type: IV induction Ventilation: Mask ventilation without difficulty Laryngoscope Size: Mac and 3 Grade View: Grade II Tube type: Oral Tube size: 7.0 mm Number of attempts: 1 Airway Equipment and Method: Stylet Placement Confirmation: positive ETCO2 and breath sounds checked- equal and bilateral Secured at: 21 cm Tube secured with: Tape Dental Injury: Teeth and Oropharynx as per pre-operative assessment    Anesthesia Procedure Note 18 OG as requested with no return

## 2014-02-13 NOTE — Progress Notes (Signed)
Patient ID: Gwendolyn Weaver, female   DOB: 1961/01/20, 54 y.o.   MRN: [id number] Gwendolyn Weaver May 29, 1960 [id number]   Day of Surgery s/p Procedure(s): LAPAROSCOPIC ASSISTED VAGINAL HYSTERECTOMY LAPAROSCOPIC BILATERAL SALPINGO OOPHORECTOMY CYSTOSCOPY  Subjective: Patient reports feels well, no acute distress, pain severity reported mild, Yes.   taking PO, foley catheter in place, Yes.   ambulating, No. passing flatus  Objective: Vital signs in last 24 hours: Temp:  [97.7 F (36.5 C)-98.7 F (37.1 C)] 97.7 F (36.5 C) (01/19 1300) Pulse Rate:  [69-88] 76 (01/19 1300) Resp:  [15-22] 18 (01/19 1300) BP: (91-129)/(48-81) 105/60 mmHg (01/19 1300) SpO2:  [98 %-100 %] 98 % (01/19 1300) Weight:  [198 lb (89.812 kg)] 198 lb (89.812 kg) (01/19 1200) Last BM Date: 02/12/14  EXAM General: awake, alert and no distress Resp: clear to auscultation bilaterally Cardio: regular rate and rhythm GI: soft, minimal tenderness, bowel sounds present, incision oozing from infraumbilical site from underneath the [place].  Skin appears intact.  5 mm ports intact Lower Extremities: Without swelling or tenderness Vaginal Bleeding: reported scant  Lab Results:  No results for input(s): WBC, HGB, HCT, PLT in the last 72 hours.  Assessment: s/p Procedure(s): LAPAROSCOPIC ASSISTED VAGINAL HYSTERECTOMY LAPAROSCOPIC BILATERAL SALPINGO OOPHORECTOMY CYSTOSCOPY: stable and progressing well.  Oozing from the infraumbilical incision.  Pressure dressing applied.  Routine PO instructions & precautions reviewed with the patient.  Plan: Continue routine post operative care with expected discharge in the AM  LOS: 0 days    Anastasio Auerbach MD, 4:43 PM 02/13/2014

## 2014-02-14 ENCOUNTER — Encounter (HOSPITAL_COMMUNITY): Payer: Self-pay | Admitting: Gynecology

## 2014-02-14 DIAGNOSIS — N926 Irregular menstruation, unspecified: Secondary | ICD-10-CM | POA: Diagnosis not present

## 2014-02-14 LAB — CBC
HCT: 28.7 % — ABNORMAL LOW (ref 36.0–46.0)
Hemoglobin: 9.9 g/dL — ABNORMAL LOW (ref 12.0–15.0)
MCH: 29.7 pg (ref 26.0–34.0)
MCHC: 34.5 g/dL (ref 30.0–36.0)
MCV: 86.2 fL (ref 78.0–100.0)
PLATELETS: 217 10*3/uL (ref 150–400)
RBC: 3.33 MIL/uL — ABNORMAL LOW (ref 3.87–5.11)
RDW: 13.6 % (ref 11.5–15.5)
WBC: 7.4 10*3/uL (ref 4.0–10.5)

## 2014-02-14 MED ORDER — OXYCODONE-ACETAMINOPHEN 5-325 MG PO TABS: 1.0000 | ORAL_TABLET | ORAL | Status: DC | PRN

## 2014-02-14 NOTE — Progress Notes (Signed)
Pt out in wheelchair teaching complete  

## 2014-02-14 NOTE — Discharge Instructions (Signed)
°  Postoperative Instructions Hysterectomy ° °Dr. Tzion Wedel and the nursing staff have discussed postoperative instructions with you.  If you have any questions please ask them before you leave the hospital, or call Dr Amadi Frady’s office at [phone].   ° °We would like to emphasize the following instructions: ° ° °  Call the office to make your follow-up appointment as recommended by Dr Anavi Branscum (usually 2 weeks). ° °  You were given a prescription, or one was ordered for you at the pharmacy you designated.  Get that prescription filled and take the medication according to instructions. ° °  You may eat a regular diet, but slowly until you start having bowel movements. ° °  Drink plenty of water daily. ° °  Nothing in the vagina (intercourse, douching, objects of any kind) until released by Dr Alleen Kehm. ° °  No driving for two weeks.  Wait to be cleared by Dr Kenslei Hearty at your first post op check.  Car rides (short) are ok after several days at home, as long as you are not having significant pain, but no traveling out of town. ° °  You may shower, but no baths.  Walking up and down stairs is ok.  No heavy lifting, prolonged standing, repeated bending or any “working out” until your first post op check. ° °  Rest frequently, listen to your body and do not push yourself and overdo it. ° °  Call if: ° °o Your pain medication does not seem strong enough. °o Worsening pain or abdominal bloating °o Persistent nausea or vomiting °o Difficulty with urination or bowel movements. °o Temperature of 101 degrees or higher. °o Bleeding heavier then staining (clots or period type flow). °o Incisions become red, tender or begin to drain. °o You have any questions or concerns. °

## 2014-02-14 NOTE — Progress Notes (Signed)
Patient ID: Gwendolyn Weaver, female   DOB: 09-24-1960, 54 y.o.   MRN: [id number] Gwendolyn Weaver 1960/10/21 [id number]   1 Day Post-Op Procedure(s) (LRB): LAPAROSCOPIC ASSISTED VAGINAL HYSTERECTOMY (N/A) LAPAROSCOPIC BILATERAL SALPINGO OOPHORECTOMY (Bilateral) CYSTOSCOPY (N/A)  Subjective: Patient reports has no problems, feels well, pain severity reported mild, Yes.   taking PO, foley catheter out, Yes.   voiding, Yes.   ambulating, Yes.   passing flatus  Objective: Vital signs in last 24 hours: Temp:  [97.7 F (36.5 C)-98.7 F (37.1 C)] 98.2 F (36.8 C) (01/20 0547) Pulse Rate:  [69-92] 77 (01/20 0547) Resp:  [15-22] 16 (01/20 0547) BP: (91-124)/(48-70) 123/67 mmHg (01/20 0547) SpO2:  [98 %-100 %] 99 % (01/20 0547) Weight:  [198 lb (89.812 kg)] 198 lb (89.812 kg) (01/19 1200) Last BM Date: 02/12/14    EXAM General: awake, alert and no distress Resp: clear to auscultation bilaterally Cardio: regular rate and rhythm GI: soft, minmal tenderness, bowel sounds active, incisions dry intact Lower Extremities: Without swelling or tenderness Vaginal Bleeding: Reported scant   Lab Results:   Recent Labs  02/14/14 0525  WBC 7.4  HGB 9.9*  HCT 28.7*  PLT 217    Assessment: s/p Procedure(s): LAPAROSCOPIC ASSISTED VAGINAL HYSTERECTOMY LAPAROSCOPIC BILATERAL SALPINGO OOPHORECTOMY CYSTOSCOPY: progressing well, ready for discharge.  Ambulating, eating, voiding with minimal pain relieved with PO pain meds.  Plan: Discharge home today.  Precautions, instructions and follow up were again discussed with the patient and husband.  Prescriptions provided per AVS.  Patient to call the office to arrange a post-operative appointmant in 2 weeks.    Anastasio Auerbach MD, 8:40 AM 02/14/2014

## 2014-02-14 NOTE — Anesthesia Postprocedure Evaluation (Signed)
  Anesthesia Post-op Note  Patient: Gwendolyn Weaver  Procedure(s) Performed: Procedure(s): LAPAROSCOPIC ASSISTED VAGINAL HYSTERECTOMY (N/A) LAPAROSCOPIC BILATERAL SALPINGO OOPHORECTOMY (Bilateral) CYSTOSCOPY (N/A)  Patient Location: [facility]  Anesthesia Type:General  Level of Consciousness: awake, alert , oriented and patient cooperative  Airway and Oxygen Therapy: Patient Spontanous Breathing  Post-op Pain: mild  Post-op Assessment: Post-op Vital signs reviewed, Patient's Cardiovascular Status Stable, Respiratory Function Stable, Patent Airway and No signs of Nausea or vomiting  Post-op Vital Signs: Reviewed and stable  Last Vitals:  Filed Vitals:   02/14/14 0547  BP: 123/67  Pulse: 77  Temp: 36.8 C  Resp: 16    Complications: No apparent anesthesia complications

## 2014-02-14 NOTE — Discharge Summary (Signed)
  Gwendolyn Weaver [birth year] [id number]   Discharge Summary  Date of Admission:  02/13/2014  Date of Discharge:  02/14/2014  Discharge Diagnosis:  Menorrhagia, leiomyoma, adenomyosis  Procedure:  Procedure(s): LAPAROSCOPIC ASSISTED VAGINAL HYSTERECTOMY LAPAROSCOPIC BILATERAL SALPINGO OOPHORECTOMY CYSTOSCOPY  Pathology: Uterus, ovaries and fallopian tubes, with cervix LEIOMYOMATA AND ADENOMYOSIS. ENDOMETRIUM: BENIGN INACTIVE ENDOMETRIUM WITH PSEUDODECIDUALIZED STROMA, NO ATYPIA, HYPERPLASIA OR MALIGNANCY. CERVIX: BENIGN SQUAMOUS MUCOSA AND ENDOCERVICAL MUCOSA, NO DYSPLASIA OR MALIGNANCY. UTERINE SEROSA: ADHESIONS, NO EVIDENCE OF ENDOMETRIOSIS, ATYPIA OR MALIGNANCY. BILATERAL OVARIES AND FALLOPIAN TUBES: NO PATHOLOGICAL ABNORMALITIES.  Hospital Course:  Patient underwent an uncomplicated LAVH BSO 54/65/6812. Patient's postoperative course was uncomplicated and she was discharged home the morning following surgery ambulating well, tolerating a regular diet, voiding without difficulty with good pain relief on oral medication. Preoperative hemoglobin 12 postoperative hemoglobin 9.9 The patient received instructions for postoperative care and call precautions.  She received prescriptions per AVS and will be seen in the office 2 weeks following discharge.       Anastasio Auerbach MD, 3:07 PM 02/14/2014

## 2014-02-27 ENCOUNTER — Ambulatory Visit (INDEPENDENT_AMBULATORY_CARE_PROVIDER_SITE_OTHER): Payer: BLUE CROSS/BLUE SHIELD | Admitting: Gynecology

## 2014-02-27 ENCOUNTER — Telehealth: Payer: Self-pay

## 2014-02-27 ENCOUNTER — Encounter: Payer: Self-pay | Admitting: Gynecology

## 2014-02-27 VITALS — BP 130/80

## 2014-02-27 DIAGNOSIS — Z9889 Other specified postprocedural states: Secondary | ICD-10-CM

## 2014-02-27 NOTE — Progress Notes (Signed)
Gwendolyn Weaver [birth year] [id number]        54 y.o.  G2P1001 Presents 2 weeks status post LAVH BSO doing well. Did not start on HRT. Having some mild hot flushes but tolerable.  Past medical history,surgical history, problem list, medications, allergies, family history and social history were all reviewed and documented in the EPIC chart.  Directed ROS with pertinent positives and negatives documented in the history of present illness/assessment and plan.  Exam: Kim assistant General appearance:  Normal Abdomen soft nontender without masses guarding rebound. Incisions healed nicely. Pelvic external BUS vagina with suture line intact. Bimanual without masses or tenderness  Assessment/Plan:  54 y.o. G2P1001 postop status post LAVH BSO 2 weeks ago doing well. Continue to slowly resume normal activities with the exception of continued pelvic rest. Reviewed pathology which showed leiomyoma and adenomyosis. Follow up in 2 weeks for her next postoperative visit. Sooner if any issues.     Anastasio Auerbach MD, 10:14 AM 02/27/2014

## 2014-02-27 NOTE — Patient Instructions (Signed)
Follow up in 2 weeks for your next postoperative visit. Call sooner if any issues.

## 2014-02-27 NOTE — Telephone Encounter (Signed)
Rosemarie Ax contacted me saying patient had told her that she discussed with you her returning to work on 03/12/13 and she needs a letter to return to work then.  I noticed her next post op visit is scheduled for 03/15/14.  Ok for return to work 03/12/14 prior to post op visit?

## 2014-02-27 NOTE — Telephone Encounter (Signed)
okay

## 2014-02-27 NOTE — Telephone Encounter (Signed)
Copy of letter in chart. Mailed to patient at her request.

## 2014-03-15 ENCOUNTER — Ambulatory Visit (INDEPENDENT_AMBULATORY_CARE_PROVIDER_SITE_OTHER): Payer: BLUE CROSS/BLUE SHIELD | Admitting: Gynecology

## 2014-03-15 ENCOUNTER — Encounter: Payer: Self-pay | Admitting: Gynecology

## 2014-03-15 VITALS — BP 134/84

## 2014-03-15 DIAGNOSIS — Z9889 Other specified postprocedural states: Secondary | ICD-10-CM

## 2014-03-15 NOTE — Progress Notes (Signed)
Gwendolyn Weaver [birth year] [id number]        54 y.o.  G2P1001 Presents 1 month postop status post LAVH BSO for leiomyoma/adenomyosis. Doing well off HRT.  Past medical history,surgical history, problem list, medications, allergies, family history and social history were all reviewed and documented in the EPIC chart.  Directed ROS with pertinent positives and negatives documented in the history of present illness/assessment and plan.  Exam: Kim assistant General appearance:  Normal Abdomen soft nontender without masses guarding rebound. Incisions healed nicely. Pelvic external BUS vagina with cuff healing nicely, suture line intact. Bimanual without masses or tenderness  Assessment/Plan:  54 y.o. G2P1001 with normal 1 month postop checkup status post LAVH BSO. Patient will slowly resume normal activities with the exception of pelvic rest for another 2-3 weeks. Options for follow up exam in 2 weeks versus observation reviewed patients comfortable just monitoring and calling if any issues but otherwise will follow up in June/July when she is due for her annual exam.     Anastasio Auerbach MD, 12:32 PM 03/15/2014

## 2014-03-15 NOTE — Patient Instructions (Signed)
Slowly resume normal activities with the exception of nothing in the vagina for another 2-3 weeks. Call if any questions whatsoever. Follow up in June/July for annual exam.

## 2014-06-03 ENCOUNTER — Other Ambulatory Visit: Payer: Self-pay | Admitting: Family Medicine

## 2014-07-24 ENCOUNTER — Ambulatory Visit (INDEPENDENT_AMBULATORY_CARE_PROVIDER_SITE_OTHER): Payer: BLUE CROSS/BLUE SHIELD | Admitting: Family Medicine

## 2014-07-24 ENCOUNTER — Encounter: Payer: Self-pay | Admitting: Family Medicine

## 2014-07-24 VITALS — BP 112/70 | HR 67 | Temp 97.9°F | Resp 16 | Ht 66.5 in | Wt 202.4 lb

## 2014-07-24 DIAGNOSIS — L509 Urticaria, unspecified: Secondary | ICD-10-CM | POA: Diagnosis not present

## 2014-07-24 MED ORDER — PREDNISONE 10 MG PO TABS: ORAL_TABLET | ORAL | Status: DC

## 2014-07-24 MED ORDER — CETIRIZINE HCL 10 MG PO TABS: 10.0000 mg | ORAL_TABLET | Freq: Every day | ORAL | Status: DC

## 2014-07-24 MED ORDER — METHYLPREDNISOLONE ACETATE 80 MG/ML IJ SUSP
80.0000 mg | Freq: Once | INTRAMUSCULAR | Status: AC
  Administered 2014-07-24: 80 mg via INTRAMUSCULAR

## 2014-07-24 NOTE — Patient Instructions (Signed)
Hives Hives are itchy, red, swollen areas of the skin. They can vary in size and location on your body. Hives can come and go for hours or several days (acute hives) or for several weeks (chronic hives). Hives do not spread from person to person (noncontagious). They may get worse with scratching, exercise, and emotional stress. CAUSES   Allergic reaction to food, additives, or drugs.  Infections, including the common cold.  Illness, such as vasculitis, lupus, or thyroid disease.  Exposure to sunlight, heat, or cold.  Exercise.  Stress.  Contact with chemicals. SYMPTOMS   Red or white swollen patches on the skin. The patches may change size, shape, and location quickly and repeatedly.  Itching.  Swelling of the hands, feet, and face. This may occur if hives develop deeper in the skin. DIAGNOSIS  Your caregiver can usually tell what is wrong by performing a physical exam. Skin or blood tests may also be done to determine the cause of your hives. In some cases, the cause cannot be determined. TREATMENT  Mild cases usually get better with medicines such as antihistamines. Severe cases may require an emergency epinephrine injection. If the cause of your hives is known, treatment includes avoiding that trigger.  HOME CARE INSTRUCTIONS   Avoid causes that trigger your hives.  Take antihistamines as directed by your caregiver to reduce the severity of your hives. Non-sedating or low-sedating antihistamines are usually recommended. Do not drive while taking an antihistamine.  Take any other medicines prescribed for itching as directed by your caregiver.  Wear loose-fitting clothing.  Keep all follow-up appointments as directed by your caregiver. SEEK MEDICAL CARE IF:   You have persistent or severe itching that is not relieved with medicine.  You have painful or swollen joints. SEEK IMMEDIATE MEDICAL CARE IF:   You have a fever.  Your tongue or lips are swollen.  You have  trouble breathing or swallowing.  You feel tightness in the throat or chest.  You have abdominal pain. These problems may be the first sign of a life-threatening allergic reaction. Call your local emergency services (911 in [place] MAKE SURE YOU:   Understand these instructions.  Will watch your condition.  Will get help right away if you are not doing well or get worse. Document Released: 01/12/2005 Document Revised: 01/17/2013 Document Reviewed: 04/07/2011 Exit[employer] Patient Information 2015 [employer]. This information is not intended to replace advice given to you by your health care provider. Make sure you discuss any questions you have with your health care provider.  

## 2014-07-24 NOTE — Progress Notes (Signed)
Patient ID: Gwendolyn Weaver, female    DOB: January 08, 1961  Age: 54 y.o. MRN: [id number]    Subjective:  Subjective HPI Gwendolyn Weaver presents for f/u hives.   She was seen in UC and given cream and abx but it keeps coming back.   She is allergic to direct sun light.  She said she used to break out a lot at a kid but they never figured out what caused it.   It started 7 1/2 weeks ago.    Review of Systems  Constitutional: Negative for activity change, appetite change, fatigue and unexpected weight change.  Respiratory: Negative for cough and shortness of breath.   Cardiovascular: Negative for chest pain and palpitations.  Skin: Positive for rash. Negative for wound.  Psychiatric/Behavioral: Negative for behavioral problems and dysphoric mood. The patient is not nervous/anxious.     History Past Medical History  Diagnosis Date  . 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 Acid reflux disease   . Complication of anesthesia     inability to void    She has past surgical history that includes Cesarean section; Hemorrhoid surgery; Parathyroid exploration (12/2009); Hemorrhoid surgery (80'S); Dilatation & curettage/hysteroscopy with trueclear (N/A, 10/19/2013); Dilation and curettage of uterus; Laparoscopic assisted vaginal hysterectomy (N/A, 02/13/2014); Laparoscopic bilateral salpingo oophorectomy (Bilateral, 02/13/2014); Cystoscopy (N/A, 02/13/2014); and Abdominal hysterectomy.   Her family history includes Cancer (age of onset: 50) in her father; Hypertension in her father; Other in her father.She reports that she has never smoked. She has never used smokeless tobacco. She reports that she does not drink alcohol or use illicit drugs.  Current Outpatient Prescriptions on File Prior to Visit  Medication Sig Dispense Refill  . acetaminophen (TYLENOL) 500 MG tablet Take 1,000 mg by mouth every 6 (six) hours as needed (headache).    Marland Kitchen BIOTIN PO Take 1  tablet by mouth daily.     . Flaxseed, Linseed, (FLAX SEED OIL PO) Take 2 tablets by mouth daily.     . Multiple Vitamin (MULTIVITAMIN) tablet Take 1 tablet by mouth daily.     Marland Kitchen omeprazole (PRILOSEC) 20 MG capsule TAKE 1 CAPSULE DAILY 90 capsule 3   No current facility-administered medications on file prior to visit.     Objective:  Objective Physical Exam  Constitutional: She is oriented to person, place, and time. She appears well-developed and well-nourished.  HENT:  Head: Normocephalic and atraumatic.  Eyes: Conjunctivae and EOM are normal.  Neck: Normal range of motion. Neck supple. No JVD present. Carotid bruit is not present. No thyromegaly present.  Cardiovascular: Normal rate, regular rhythm and normal heart sounds.   No murmur heard. Pulmonary/Chest: Effort normal and breath sounds normal. No respiratory distress. She has no wheezes. She has no rales. She exhibits no tenderness.  Musculoskeletal: She exhibits no edema.  Neurological: She is alert and oriented to person, place, and time.  Skin: Rash noted. Rash is papular and urticarial.     Psychiatric: She has a normal mood and affect. Her behavior is normal.   BP 112/70 mmHg  Pulse 67  Temp(Src) 97.9 F (36.6 C) (Oral)  Resp 16  Ht 5' 6.5" (1.689 m)  Wt 202 lb 6.4 oz (91.808 kg)  BMI 32.18 kg/m2  SpO2 98%  LMP 09/28/2013 Wt Readings from Last 3 Encounters:  07/24/14 202 lb 6.4 oz (91.808 kg)  02/13/14 198 lb (89.812 kg)  02/06/14 198 lb (89.812 kg)     Lab  Results  Component Value Date   WBC 7.4 02/14/2014   HGB 9.9* 02/14/2014   HCT 28.7* 02/14/2014   PLT 217 02/14/2014   GLUCOSE 98 02/06/2014   CHOL 251* 06/28/2013   TRIG 154* 06/28/2013   HDL 43 06/28/2013   LDLDIRECT 156.0 05/13/2012   LDLCALC 177* 06/28/2013   ALT 16 02/06/2014   AST 19 02/06/2014   NA 140 02/06/2014   K 3.5 02/06/2014   CL 110 02/06/2014   CREATININE 1.10 02/06/2014   BUN 14 02/06/2014   CO2 25 02/06/2014   TSH 0.91  09/14/2013   INR 1.02 12/24/2009    No results found.   Assessment & Plan:  Plan I am having Ms. Hession start on predniSONE and cetirizine. I am also having her maintain her multivitamin, acetaminophen, BIOTIN PO, (Flaxseed, Linseed, (FLAX SEED OIL PO)), and omeprazole. We administered methylPREDNISolone acetate.  Meds ordered this encounter  Medications  . predniSONE (DELTASONE) 10 MG tablet    Sig: 3 po qd for 3 days then 2 po qd for 3 days the 1 po qd for 3 days    Dispense:  18 tablet    Refill:  0  . cetirizine (ZYRTEC) 10 MG tablet    Sig: Take 1 tablet (10 mg total) by mouth daily.    Dispense:  100 tablet    Refill:  2  . methylPREDNISolone acetate (DEPO-MEDROL) injection 80 mg    Sig:     Problem List Items Addressed This Visit    None    Visit Diagnoses    Hives    -  Primary    Relevant Medications    predniSONE (DELTASONE) 10 MG tablet    cetirizine (ZYRTEC) 10 MG tablet    methylPREDNISolone acetate (DEPO-MEDROL) injection 80 mg (Completed)     if hives return consider allergy referral  Follow-up: Return if symptoms worsen or fail to improve.  Garnet Koyanagi, DO

## 2014-07-24 NOTE — Progress Notes (Signed)
Pre visit review using our clinic review tool, if applicable. No additional management support is needed unless otherwise documented below in the visit note. 

## 2014-09-03 ENCOUNTER — Other Ambulatory Visit: Payer: Self-pay

## 2014-09-03 DIAGNOSIS — Z1231 Encounter for screening mammogram for malignant neoplasm of breast: Secondary | ICD-10-CM

## 2014-09-14 ENCOUNTER — Encounter: Payer: Self-pay | Admitting: Women's Health

## 2014-09-14 ENCOUNTER — Ambulatory Visit
Admission: RE | Admit: 2014-09-14 | Discharge: 2014-09-14 | Disposition: A | Discharge status: Home / Self Care | Payer: BLUE CROSS/BLUE SHIELD | Source: Ambulatory Visit

## 2014-09-14 DIAGNOSIS — Z1231 Encounter for screening mammogram for malignant neoplasm of breast: Secondary | ICD-10-CM

## 2014-09-17 ENCOUNTER — Encounter: Payer: Self-pay | Admitting: Family Medicine

## 2014-10-29 ENCOUNTER — Telehealth: Payer: Self-pay | Admitting: Family Medicine

## 2014-10-29 MED ORDER — OMEPRAZOLE 20 MG PO CPDR: 20.0000 mg | DELAYED_RELEASE_CAPSULE | Freq: Every day | ORAL | Status: DC

## 2014-10-29 NOTE — Telephone Encounter (Signed)
[facility]ep[facility]ole #30 sent to [facility] [place].    KP

## 2014-10-29 NOTE — Telephone Encounter (Signed)
Caller name: Geri Hepler   Relationship to patient: Self    Can be reached: [phone]   Pharmacy: [facility], [place]  Reason for call: pt is requesting a refill on her omeprazole Rx.   Pt says that she is completely out and would like to know if she can have a few sent to [facility] local [facility] in [place] , [address].

## 2014-12-03 ENCOUNTER — Other Ambulatory Visit: Payer: Self-pay | Admitting: Family Medicine

## 2014-12-03 NOTE — Telephone Encounter (Signed)
Rx was sent to [facility] on 10/29/14.

## 2014-12-10 ENCOUNTER — Telehealth: Payer: Self-pay | Admitting: Family Medicine

## 2014-12-10 NOTE — Telephone Encounter (Signed)
Caller name: Self   Can be reached:Pharmacy: [phone]  Reason for call: Patient states that her insurance is awaiting authorization for omeprazole (PRILOSEC) 20 MG capsule [id number] [employer]'s number is [id number]

## 2014-12-11 NOTE — Telephone Encounter (Signed)
Initiated, awaiting determination. JG//CMA  

## 2014-12-17 ENCOUNTER — Telehealth: Payer: Self-pay | Admitting: Family Medicine

## 2014-12-17 MED ORDER — OMEPRAZOLE 20 MG PO CPDR: DELAYED_RELEASE_CAPSULE | ORAL | Status: DC

## 2014-12-17 NOTE — Telephone Encounter (Signed)
Rx faxed.    KP 

## 2014-12-17 NOTE — Telephone Encounter (Signed)
Caller name: Self   Can be reached: [phone]  Pharmacy: [facility], [place] [address]  Phone: [phone] (Select option 3)   Reason for call: Patient states that she gave the wrong number for this medication to be called in. Correct number above. omeprazole (PRILOSEC) 20 MG capsule [id number]

## 2015-06-10 ENCOUNTER — Other Ambulatory Visit: Payer: Self-pay | Admitting: Family Medicine

## 2015-06-19 ENCOUNTER — Encounter: Payer: Self-pay | Admitting: Gastroenterology

## 2015-10-08 ENCOUNTER — Other Ambulatory Visit: Payer: Self-pay | Admitting: Family Medicine

## 2015-10-08 ENCOUNTER — Other Ambulatory Visit: Payer: Self-pay | Admitting: Gynecology

## 2015-10-08 DIAGNOSIS — Z1231 Encounter for screening mammogram for malignant neoplasm of breast: Secondary | ICD-10-CM

## 2015-10-24 ENCOUNTER — Ambulatory Visit
Admission: RE | Admit: 2015-10-24 | Discharge: 2015-10-24 | Disposition: A | Discharge status: Home / Self Care | Payer: Managed Care, Other (non HMO) | Source: Ambulatory Visit | Attending: Gynecology | Admitting: Gynecology

## 2015-10-24 DIAGNOSIS — Z1231 Encounter for screening mammogram for malignant neoplasm of breast: Secondary | ICD-10-CM

## 2015-11-13 ENCOUNTER — Telehealth: Payer: Self-pay | Admitting: Family Medicine

## 2015-11-13 NOTE — Telephone Encounter (Signed)
Self. Refill request for omeprazole   Pharmacy: [facility], [place]

## 2015-11-13 NOTE — Telephone Encounter (Signed)
She has not been seen in over a year, she needs an appointment.    KP

## 2015-11-15 NOTE — Telephone Encounter (Signed)
Called pt. lvm advising her that she will need an appt. Provided phone # for cb to schedule.

## 2015-11-25 NOTE — Telephone Encounter (Signed)
Patient has appt scheduled for 01/06/16, which is the next available follow up appt. Patient would like to know if she can get her rx filled before that date? If not, would you like me to schedule her sooner in another slot, same day or acute. Please advise.

## 2015-11-27 NOTE — Telephone Encounter (Signed)
Please let pt know she can purchase omeprazole 20mg  over the counter until her next appt. Thanks!

## 2015-11-28 NOTE — Telephone Encounter (Signed)
Patient aware and stated that she might just go that route from now on. She was very pleasant.

## 2016-01-06 ENCOUNTER — Encounter: Payer: Self-pay | Admitting: Family Medicine

## 2016-01-06 ENCOUNTER — Ambulatory Visit (INDEPENDENT_AMBULATORY_CARE_PROVIDER_SITE_OTHER): Payer: Managed Care, Other (non HMO) | Admitting: Family Medicine

## 2016-01-06 VITALS — BP 127/88 | HR 68 | Temp 98.0°F | Ht 67.0 in | Wt 176.0 lb

## 2016-01-06 DIAGNOSIS — K219 Gastro-esophageal reflux disease without esophagitis: Secondary | ICD-10-CM | POA: Diagnosis not present

## 2016-01-06 MED ORDER — OMEPRAZOLE 20 MG PO CPDR: DELAYED_RELEASE_CAPSULE | ORAL | 3 refills | Status: DC

## 2016-01-06 NOTE — Progress Notes (Signed)
Patient ID: Gwendolyn Weaver, female    DOB: 1960/12/31  Age: 55 y.o. MRN: [id number]    Subjective:  Subjective  HPI Gwendolyn Weaver presents for med refill -- omeprazole.  She has been doing better since she lost weight with weight watchers.  No complaints.    Review of Systems  Constitutional: Negative for activity change, appetite change, fatigue and unexpected weight change.  Respiratory: Negative for cough and shortness of breath.   Cardiovascular: Negative for chest pain and palpitations.  Psychiatric/Behavioral: Negative for behavioral problems and dysphoric mood. The patient is not nervous/anxious.     History Past Medical History:  Diagnosis Date  . Acid reflux disease   . Complication of anesthesia    inability to void  . 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   Her family history includes Cancer (age of onset: 52) in her father; Hypertension in her father; Other in her father.She reports that she has never smoked. She has never used smokeless tobacco. She reports that she does not drink alcohol or use drugs.  Current Outpatient Prescriptions on File Prior to Visit  Medication Sig Dispense Refill  . acetaminophen (TYLENOL) 500 MG tablet Take 1,000 mg by mouth every 6 (six) hours as needed (headache).    Marland Kitchen BIOTIN PO Take 1 tablet by mouth daily.     . Flaxseed, Linseed, (FLAX SEED OIL PO) Take 2 tablets by mouth daily.     . Multiple Vitamin (MULTIVITAMIN) tablet Take 1 tablet by mouth  daily.     . cetirizine (ZYRTEC) 10 MG tablet Take 1 tablet (10 mg total) by mouth daily. (Patient not taking: Reported on 01/06/2016) 100 tablet 2  . predniSONE (DELTASONE) 10 MG tablet 3 po qd for 3 days then 2 po qd for 3 days the 1 po qd for 3 days (Patient not taking: Reported on 01/06/2016) 18 tablet 0   No current facility-administered medications on file prior to visit.      Objective:  Objective  Physical Exam  Constitutional: She is oriented to person, place, and time. She appears well-developed and well-nourished.  HENT:  Head: Normocephalic and atraumatic.  Eyes: Conjunctivae and EOM are normal.  Neck: Normal range of motion. Neck supple. No JVD present. Carotid bruit is not present. No thyromegaly present.  Cardiovascular: Normal rate, regular rhythm and normal heart sounds.   No murmur heard. Pulmonary/Chest: Effort normal and breath sounds normal. No respiratory distress. She has no wheezes. She has no rales. She exhibits no tenderness.  Musculoskeletal: She exhibits no edema.  Neurological: She is alert and oriented to person, place, and time.  Psychiatric: She has a normal mood and affect. Her behavior is normal. Judgment and thought content normal.  Nursing note and vitals reviewed.  BP 127/88   Pulse 68   Temp 98 F (36.7 C) (Oral)   Ht 5\' 7"  (1.702 m)   Wt 176 lb (79.8 kg)   LMP 09/28/2013   SpO2 100%   BMI 27.57 kg/m  Wt Readings from Last 3 Encounters:  01/06/16 176 lb (79.8 kg)  07/24/14 202 lb 6.4 oz (91.8 kg)  02/13/14 198 lb (89.8 kg)     Lab Results  Component Value Date   WBC 7.4 02/14/2014   HGB 9.9 (L) 02/14/2014   HCT 28.7 (L) 02/14/2014   PLT 217 02/14/2014   GLUCOSE 98 02/06/2014   CHOL 251 (H) 06/28/2013   TRIG 154 (H) 06/28/2013   HDL 43 06/28/2013   LDLDIRECT 156.0 05/13/2012   LDLCALC 177 (H) 06/28/2013   ALT 16 02/06/2014   AST 19 02/06/2014   NA 140 02/06/2014   K 3.5 02/06/2014   CL 110 02/06/2014   CREATININE 1.10  02/06/2014   BUN 14 02/06/2014   CO2 25 02/06/2014   TSH 0.91 09/14/2013   INR 1.02 12/24/2009    Mm Digital Screening Bilateral  Result Date: 10/25/2015 CLINICAL DATA:  Screening. EXAM: DIGITAL SCREENING BILATERAL MAMMOGRAM WITH CAD COMPARISON:  Previous exam(s). ACR Breast Density Category c: The breast tissue is heterogeneously dense, which may obscure small masses. FINDINGS: There are no findings suspicious for malignancy. Images were processed with CAD. IMPRESSION: No mammographic evidence of malignancy. A result letter of this screening mammogram will be mailed directly to the patient. RECOMMENDATION: Screening mammogram in one year. (Code:SM-B-01Y) BI-RADS CATEGORY  1: Negative. Electronically Signed   By: Lajean Manes M.D.   On: 10/25/2015 16:17     Assessment & Plan:  Plan  I am having Ms. Counts maintain her multivitamin, acetaminophen, BIOTIN PO, (Flaxseed, Linseed, (FLAX SEED OIL PO)), predniSONE, cetirizine, and omeprazole.  Meds ordered this encounter  Medications  . omeprazole (PRILOSEC) 20 MG capsule    Sig: TAKE 1 CAPSULE BY MOUTH DAILY (NEED TO SEE DR BEFORE MORE REFILLS)    Dispense:  90 capsule    Refill:  3    Problem List Items Addressed This Visit    None    Visit Diagnoses    Gastroesophageal reflux disease, esophagitis presence not specified    -  Primary   Relevant Medications   omeprazole (PRILOSEC) 20 MG capsule      Follow-up: Return in about 1 year (around 01/05/2017) for annual exam, fasting.  [name], DO

## 2016-01-06 NOTE — Patient Instructions (Signed)
Food Choices for Gastroesophageal Reflux Disease, Adult When you have gastroesophageal reflux disease (GERD), the foods you eat and your eating habits are very important. Choosing the right foods can help ease your discomfort. What guidelines do I need to follow?  Choose fruits, vegetables, whole grains, and low-fat dairy products.  Choose low-fat meat, fish, and poultry.  Limit fats such as oils, salad dressings, butter, nuts, and avocado.  Keep a food diary. This helps you identify foods that cause symptoms.  Avoid foods that cause symptoms. These may be different for everyone.  Eat small meals often instead of 3 large meals a day.  Eat your meals slowly, in a place where you are relaxed.  Limit fried foods.  Cook foods using methods other than frying.  Avoid drinking alcohol.  Avoid drinking large amounts of liquids with your meals.  Avoid bending over or lying down until 2-3 hours after eating. What foods are not recommended? These are some foods and drinks that may make your symptoms worse: Vegetables  Tomatoes. Tomato juice. Tomato and spaghetti sauce. Chili peppers. Onion and garlic. Horseradish. Fruits  Oranges, grapefruit, and lemon (fruit and juice). Meats  High-fat meats, fish, and poultry. This includes hot dogs, ribs, ham, sausage, salami, and bacon. Dairy  Whole milk and chocolate milk. Sour cream. Cream. Butter. Ice cream. Cream cheese. Drinks  Coffee and tea. Bubbly (carbonated) drinks or energy drinks. Condiments  Hot sauce. Barbecue sauce. Sweets/Desserts  Chocolate and cocoa. Donuts. Peppermint and spearmint. Fats and Oils  High-fat foods. This includes French fries and potato chips. Other  Vinegar. Strong spices. This includes black pepper, white pepper, red pepper, cayenne, curry powder, cloves, ginger, and chili powder. The items listed above may not be a complete list of foods and drinks to avoid. Contact your dietitian for more information.    This information is not intended to replace advice given to you by your health care provider. Make sure you discuss any questions you have with your health care provider. Document Released: 07/14/2011 Document Revised: 06/20/2015 Document Reviewed: 11/16/2012 Elsevier Interactive Patient Education  2017 [employer]  

## 2016-01-06 NOTE — Progress Notes (Signed)
Pre visit review using our clinic tool,if applicable. No additional management support is needed unless otherwise documented below in the visit note.  

## 2016-06-10 ENCOUNTER — Encounter: Payer: Self-pay | Admitting: Gynecology

## 2016-10-27 ENCOUNTER — Other Ambulatory Visit: Payer: Self-pay | Admitting: Women's Health

## 2016-10-27 ENCOUNTER — Other Ambulatory Visit: Payer: Self-pay | Admitting: Gynecology

## 2016-10-27 DIAGNOSIS — Z1231 Encounter for screening mammogram for malignant neoplasm of breast: Secondary | ICD-10-CM

## 2016-11-12 ENCOUNTER — Ambulatory Visit
Admission: RE | Admit: 2016-11-12 | Discharge: 2016-11-12 | Disposition: A | Discharge status: Home / Self Care | Payer: Managed Care, Other (non HMO) | Source: Ambulatory Visit | Attending: Women's Health | Admitting: Women's Health

## 2016-11-12 DIAGNOSIS — Z1231 Encounter for screening mammogram for malignant neoplasm of breast: Secondary | ICD-10-CM

## 2016-11-13 ENCOUNTER — Other Ambulatory Visit: Payer: Self-pay | Admitting: Women's Health

## 2016-11-13 DIAGNOSIS — R928 Other abnormal and inconclusive findings on diagnostic imaging of breast: Secondary | ICD-10-CM

## 2016-11-19 ENCOUNTER — Ambulatory Visit
Admission: RE | Admit: 2016-11-19 | Discharge: 2016-11-19 | Disposition: A | Discharge status: Home / Self Care | Payer: Managed Care, Other (non HMO) | Source: Ambulatory Visit | Attending: Women's Health | Admitting: Women's Health

## 2016-11-19 ENCOUNTER — Encounter: Payer: Self-pay | Admitting: Women's Health

## 2016-11-19 ENCOUNTER — Ambulatory Visit: Payer: Managed Care, Other (non HMO)

## 2016-11-19 DIAGNOSIS — R928 Other abnormal and inconclusive findings on diagnostic imaging of breast: Secondary | ICD-10-CM

## 2016-11-20 ENCOUNTER — Other Ambulatory Visit: Payer: Self-pay | Admitting: Women's Health

## 2017-01-08 ENCOUNTER — Encounter: Payer: Self-pay | Admitting: Family Medicine

## 2017-01-08 ENCOUNTER — Ambulatory Visit (INDEPENDENT_AMBULATORY_CARE_PROVIDER_SITE_OTHER): Payer: Managed Care, Other (non HMO) | Admitting: Family Medicine

## 2017-01-08 ENCOUNTER — Encounter: Payer: Managed Care, Other (non HMO) | Admitting: Family Medicine

## 2017-01-08 VITALS — BP 122/80 | HR 68 | Temp 98.1°F | Resp 16 | Ht 67.0 in | Wt 186.6 lb

## 2017-01-08 DIAGNOSIS — E559 Vitamin D deficiency, unspecified: Secondary | ICD-10-CM

## 2017-01-08 DIAGNOSIS — E213 Hyperparathyroidism, unspecified: Secondary | ICD-10-CM

## 2017-01-08 DIAGNOSIS — E785 Hyperlipidemia, unspecified: Secondary | ICD-10-CM | POA: Diagnosis not present

## 2017-01-08 DIAGNOSIS — K219 Gastro-esophageal reflux disease without esophagitis: Secondary | ICD-10-CM

## 2017-01-08 DIAGNOSIS — E210 Primary hyperparathyroidism: Secondary | ICD-10-CM

## 2017-01-08 DIAGNOSIS — Z23 Encounter for immunization: Secondary | ICD-10-CM

## 2017-01-08 DIAGNOSIS — Z0000 Encounter for general adult medical examination without abnormal findings: Secondary | ICD-10-CM | POA: Diagnosis not present

## 2017-01-08 LAB — COMPREHENSIVE METABOLIC PANEL
ALT: 12 U/L (ref 0–35)
AST: 17 U/L (ref 0–37)
Albumin: 4.4 g/dL (ref 3.5–5.2)
Alkaline Phosphatase: 83 U/L (ref 39–117)
BILIRUBIN TOTAL: 0.9 mg/dL (ref 0.2–1.2)
BUN: 13 mg/dL (ref 6–23)
CALCIUM: 10 mg/dL (ref 8.4–10.5)
CHLORIDE: 106 meq/L (ref 96–112)
CO2: 29 meq/L (ref 19–32)
CREATININE: 0.96 mg/dL (ref 0.40–1.20)
GFR: 76.52 mL/min (ref >60.00)
GLUCOSE: 83 mg/dL (ref 70–99)
Potassium: 4 mEq/L (ref 3.5–5.1)
SODIUM: 141 meq/L (ref 135–145)
Total Protein: 7.5 g/dL (ref 6.0–8.3)

## 2017-01-08 LAB — LIPID PANEL
CHOL/HDL RATIO: 4
Cholesterol: 207 mg/dL — ABNORMAL HIGH (ref 0–200)
HDL: 55 mg/dL (ref >39.00)
LDL Cholesterol: 136 mg/dL — ABNORMAL HIGH (ref 0–99)
NONHDL: 152.33
TRIGLYCERIDES: 81 mg/dL (ref 0.0–149.0)
VLDL: 16.2 mg/dL (ref 0.0–40.0)

## 2017-01-08 LAB — POC URINALSYSI DIPSTICK (AUTOMATED)
Bilirubin, UA: NEGATIVE
Blood, UA: NEGATIVE
GLUCOSE UA: NEGATIVE
Ketones, UA: NEGATIVE
LEUKOCYTES UA: NEGATIVE
Nitrite, UA: NEGATIVE
PROTEIN UA: NEGATIVE
Spec Grav, UA: 1.025 (ref 1.010–1.025)
UROBILINOGEN UA: 0.2 U/dL
pH, UA: 6 (ref 5.0–8.0)

## 2017-01-08 LAB — CBC WITH DIFFERENTIAL/PLATELET
BASOS ABS: 0 10*3/uL (ref 0.0–0.1)
Basophils Relative: 0.4 % (ref 0.0–3.0)
EOS ABS: 0 10*3/uL (ref 0.0–0.7)
Eosinophils Relative: 1.2 % (ref 0.0–5.0)
HEMATOCRIT: 38.1 % (ref 36.0–46.0)
Hemoglobin: 12.7 g/dL (ref 12.0–15.0)
Lymphs Abs: 2.1 10*3/uL (ref 0.7–4.0)
MCHC: 33.3 g/dL (ref 30.0–36.0)
MCV: 90.7 fl (ref 78.0–100.0)
MONO ABS: 0.2 10*3/uL (ref 0.1–1.0)
Monocytes Relative: 6.1 % (ref 3.0–12.0)
NEUTROS ABS: 1.2 10*3/uL — AB (ref 1.4–7.7)
NEUTROS PCT: 34.5 % — AB (ref 43.0–77.0)
PLATELETS: 249 10*3/uL (ref 150.0–400.0)
RBC: 4.2 Mil/uL (ref 3.87–5.11)
RDW: 13.7 % (ref 11.5–15.5)
WBC: 3.6 10*3/uL — ABNORMAL LOW (ref 4.0–10.5)

## 2017-01-08 LAB — TSH: TSH: 1.56 u[IU]/mL (ref 0.35–4.50)

## 2017-01-08 LAB — VITAMIN D 25 HYDROXY (VIT D DEFICIENCY, FRACTURES): VITD: 49.03 ng/mL (ref 30.00–100.00)

## 2017-01-08 MED ORDER — OMEPRAZOLE 20 MG PO CPDR: DELAYED_RELEASE_CAPSULE | ORAL | 3 refills | Status: DC

## 2017-01-08 NOTE — Assessment & Plan Note (Signed)
Check labs today.

## 2017-01-08 NOTE — Addendum Note (Signed)
Addended by: Harl Bowie on: 01/08/2017 05:24 PM   Modules accepted: Orders

## 2017-01-08 NOTE — Assessment & Plan Note (Signed)
Encouraged heart healthy diet, increase exercise, avoid trans fats, consider a krill oil cap daily 

## 2017-01-08 NOTE — Progress Notes (Addendum)
Subjective:   I acted as a Education administrator for Dr. Carollee Herter.  Guerry Bruin, CMA   Gwendolyn Weaver is a 56 y.o. female and is here for a comprehensive physical exam. The patient reports no problems.  Social History   Socioeconomic History  . Marital status: Married    Spouse name: Not on file  . Number of children: Not on file  . Years of education: Not on file  . Highest education level: Not on file  Social Needs  . Financial resource strain: Not on file  . Food insecurity - worry: Not on file  . Food insecurity - inability: Not on file  . Transportation needs - medical: Not on file  . Transportation needs - non-medical: Not on file  Occupational History  . Occupation: Sports coach  Tobacco Use  . Smoking status: Never Smoker  . Smokeless tobacco: Never Used  Substance and Sexual Activity  . Alcohol use: No  . Drug use: No  . Sexual activity: Yes    Birth control/protection: None  Other Topics Concern  . Not on file  Social History Narrative   Exercise-2 days week   Health Maintenance  Topic Date Due  . Samul Dada  07/01/2016  . Hepatitis C Screening  01/09/2028 (Originally 1960-02-25)  . HIV Screening  01/09/2028 (Originally 01/29/1975)  . COLONOSCOPY  10/27/2018  . MAMMOGRAM  11/13/2018  . INFLUENZA VACCINE  Completed  . PAP SMEAR  Discontinued    The following portions of the patient's history were reviewed and updated as appropriate:  She  has a past medical history of Acid reflux disease, Complication of anesthesia, and LGSIL (low grade squamous intraepithelial dysplasia) (09/2008). She does not have any pertinent problems on file. She  has a past surgical history that includes Cesarean section; Hemorrhoid surgery; Parathyroid exploration (12/2009); Hemorrhoid surgery (80'S); Dilatation & curettage/hysteroscopy with trueclear (N/A, 10/19/2013); Dilation and curettage of uterus; Laparoscopic assisted vaginal hysterectomy (N/A, 02/13/2014); Laparoscopic bilateral salpingo  oophorectomy (Bilateral, 02/13/2014); Cystoscopy (N/A, 02/13/2014); and Abdominal hysterectomy. Her family history includes Cancer (age of onset: 70) in her father; Hypertension in her father; Other in her father. She  reports that  has never smoked. she has never used smokeless tobacco. She reports that she does not drink alcohol or use drugs. She has a current medication list which includes the following prescription(s): acetaminophen, flaxseed (linseed), multivitamin, and omeprazole. Current Outpatient Medications on File Prior to Visit  Medication Sig Dispense Refill  . acetaminophen (TYLENOL) 500 MG tablet Take 1,000 mg by mouth every 6 (six) hours as needed (headache).    . Flaxseed, Linseed, (FLAX SEED OIL PO) Take 2 tablets by mouth daily.     . Multiple Vitamin (MULTIVITAMIN) tablet Take 1 tablet by mouth daily.      No current facility-administered medications on file prior to visit.    She has No Known Allergies..  Review of Systems Review of Systems  Constitutional: Negative for activity change, appetite change and fatigue.  HENT: Negative for hearing loss, congestion, tinnitus and ear discharge.  dentist q25m Eyes: Negative for visual disturbance (see optho q1y -- vision corrected to 20/20 with glasses).  Respiratory: Negative for cough, chest tightness and shortness of breath.   Cardiovascular: Negative for chest pain, palpitations and leg swelling.  Gastrointestinal: Negative for abdominal pain, diarrhea, constipation and abdominal distention.  Genitourinary: Negative for urgency, frequency, decreased urine volume and difficulty urinating.  Musculoskeletal: Negative for back pain, arthralgias and gait problem.  Skin: Negative for color change, pallor and rash.  Neurological: Negative for dizziness, light-headedness, numbness and headaches.  Hematological: Negative for adenopathy. Does not bruise/bleed easily.  Psychiatric/Behavioral: Negative for suicidal ideas, confusion,  sleep disturbance, self-injury, dysphoric mood, decreased concentration and agitation.       Objective:    BP 122/80 (BP Location: Left Arm, Cuff Size: Large)   Pulse 68   Temp 98.1 F (36.7 C) (Oral)   Resp 16   Ht 5\' 7"  (1.702 m)   Wt 186 lb 9.6 oz (84.6 kg)   LMP 09/28/2013   SpO2 96%   BMI 29.23 kg/m  General appearance: alert, cooperative, appears stated age and no distress Head: Normocephalic, without obvious abnormality, atraumatic Eyes: conjunctivae/corneas clear. PERRL, EOM's intact. Fundi benign. Ears: normal TM's and external ear canals both ears Nose: Nares normal. Septum midline. Mucosa normal. No drainage or sinus tenderness. Throat: lips, mucosa, and tongue normal; teeth and gums normal Neck: no adenopathy, no carotid bruit, no JVD, supple, symmetrical, trachea midline and thyroid not enlarged, symmetric, no tenderness/mass/nodules Back: symmetric, no curvature. ROM normal. No CVA tenderness. Lungs: clear to auscultation bilaterally Breasts: normal appearance, no masses or tenderness Heart: regular rate and rhythm, S1, S2 normal, no murmur, click, rub or gallop Abdomen: soft, non-tender; bowel sounds normal; no masses,  no organomegaly Pelvic: deferred and not indicated; status post hysterectomy, negative ROS Extremities: extremities normal, atraumatic, no cyanosis or edema Pulses: 2+ and symmetric Skin: Skin color, texture, turgor normal. No rashes or lesions Lymph nodes: Cervical, supraclavicular, and axillary nodes normal. Neurologic: Alert and oriented X 3, normal strength and tone. Normal symmetric reflexes. Normal coordination and gait    Assessment:    Healthy female exam.      Plan:    ghm utd Check labs See avs See After Visit Summary for Counseling Recommendations    1. Gastroesophageal reflux disease, esophagitis presence not specified Refill meds stable - omeprazole (PRILOSEC) 20 MG capsule; TAKE 1 CAPSULE BY MOUTH DAILY (NEED TO SEE DR  BEFORE MORE REFILLS)  Dispense: 90 capsule; Refill: 3  2. Hyperlipidemia LDL goal <100 Encouraged heart healthy diet, increase exercise, avoid trans fats, consider a krill oil cap daily - POCT Urinalysis Dipstick (Automated) - CBC with Differential/Platelet - Comprehensive metabolic panel - Lipid panel  3. Primary hyperparathyroidism (HCC) Check labs  - TSH - Vitamin D (25 hydroxy) - PTH, intact (no Ca)  4. Vitamin D deficiency con't supplements - Vitamin D (25 hydroxy) - PTH, intact (no Ca)  5. HYPERPARATHYROIDISM NOS    6. Hyperlipidemia with target low density lipoprotein (LDL) cholesterol less than 100 mg/dL   CMA served as scribe during this visit. History, Physical and Plan performed by medical provider. Documentation and orders reviewed and attested to.  [name], DO

## 2017-01-08 NOTE — Patient Instructions (Signed)
Preventive Care 40-64 Years, Female Preventive care refers to lifestyle choices and visits with your health care provider that can promote health and wellness. What does preventive care include?  A yearly physical exam. This is also called an annual well check.  Dental exams once or twice a year.  Routine eye exams. Ask your health care provider how often you should have your eyes checked.  Personal lifestyle choices, including: ? Daily care of your teeth and gums. ? Regular physical activity. ? Eating a healthy diet. ? Avoiding tobacco and drug use. ? Limiting alcohol use. ? Practicing safe sex. ? Taking low-dose aspirin daily starting at age 58. ? Taking vitamin and mineral supplements as recommended by your health care provider. What happens during an annual well check? The services and screenings done by your health care provider during your annual well check will depend on your age, overall health, lifestyle risk factors, and family history of disease. Counseling Your health care provider may ask you questions about your:  Alcohol use.  Tobacco use.  Drug use.  Emotional well-being.  Home and relationship well-being.  Sexual activity.  Eating habits.  Work and work Statistician.  Method of birth control.  Menstrual cycle.  Pregnancy history.  Screening You may have the following tests or measurements:  Height, weight, and BMI.  Blood pressure.  Lipid and cholesterol levels. These may be checked every 5 years, or more frequently if you are over 81 years old.  Skin check.  Lung cancer screening. You may have this screening every year starting at age 78 if you have a 30-pack-year history of smoking and currently smoke or have quit within the past 15 years.  Fecal occult blood test (FOBT) of the stool. You may have this test every year starting at age 65.  Flexible sigmoidoscopy or colonoscopy. You may have a sigmoidoscopy every 5 years or a colonoscopy  every 10 years starting at age 30.  Hepatitis C blood test.  Hepatitis B blood test.  Sexually transmitted disease (STD) testing.  Diabetes screening. This is done by checking your blood sugar (glucose) after you have not eaten for a while (fasting). You may have this done every 1-3 years.  Mammogram. This may be done every 1-2 years. Talk to your health care provider about when you should start having regular mammograms. This may depend on whether you have a family history of breast cancer.  BRCA-related cancer screening. This may be done if you have a family history of breast, ovarian, tubal, or peritoneal cancers.  Pelvic exam and Pap test. This may be done every 3 years starting at age 80. Starting at age 36, this may be done every 5 years if you have a Pap test in combination with an HPV test.  Bone density scan. This is done to screen for osteoporosis. You may have this scan if you are at high risk for osteoporosis.  Discuss your test results, treatment options, and if necessary, the need for more tests with your health care provider. Vaccines Your health care provider may recommend certain vaccines, such as:  Influenza vaccine. This is recommended every year.  Tetanus, diphtheria, and acellular pertussis (Tdap, Td) vaccine. You may need a Td booster every 10 years.  Varicella vaccine. You may need this if you have not been vaccinated.  Zoster vaccine. You may need this after age 5.  Measles, mumps, and rubella (MMR) vaccine. You may need at least one dose of MMR if you were born in  1959 or later. You may also need a second dose.  Pneumococcal 13-valent conjugate (PCV13) vaccine. You may need this if you have certain conditions and were not previously vaccinated.  Pneumococcal polysaccharide (PPSV23) vaccine. You may need one or two doses if you smoke cigarettes or if you have certain conditions.  Meningococcal vaccine. You may need this if you have certain  conditions.  Hepatitis A vaccine. You may need this if you have certain conditions or if you travel or work in places where you may be exposed to hepatitis A.  Hepatitis B vaccine. You may need this if you have certain conditions or if you travel or work in places where you may be exposed to hepatitis B.  Haemophilus influenzae type b (Hib) vaccine. You may need this if you have certain conditions.  Talk to your health care provider about which screenings and vaccines you need and how often you need them. This information is not intended to replace advice given to you by your health care provider. Make sure you discuss any questions you have with your health care provider. Document Released: 02/08/2015 Document Revised: 10/02/2015 Document Reviewed: 11/13/2014 Elsevier Interactive Patient Education  2017 [employer].

## 2017-01-11 LAB — PARATHYROID HORMONE, INTACT (NO CA): PTH: 80 pg/mL — ABNORMAL HIGH (ref 14–64)

## 2017-01-12 DIAGNOSIS — Z0000 Encounter for general adult medical examination without abnormal findings: Secondary | ICD-10-CM | POA: Insufficient documentation

## 2017-01-12 NOTE — Addendum Note (Signed)
Addended by: Kem Boroughs D on: 01/12/2017 07:00 PM   Modules accepted: Orders

## 2017-01-12 NOTE — Assessment & Plan Note (Signed)
ghm utd Check labs See AVS 

## 2017-01-15 ENCOUNTER — Telehealth: Payer: Self-pay | Admitting: *Deleted

## 2017-01-15 DIAGNOSIS — E349 Endocrine disorder, unspecified: Secondary | ICD-10-CM

## 2017-01-15 NOTE — Telephone Encounter (Signed)
Patient notified of lab results and ref to endo made.  Copied from [place] [id number]. Topic: Inquiry >> Jan 13, 2017  3:20 PM Corie Chiquito, [place] wrote: Reason for YNX:[id number] had a missed call from the office and would like a call back at [phone]

## 2017-01-21 ENCOUNTER — Encounter: Payer: Self-pay | Admitting: Endocrinology

## 2017-03-18 ENCOUNTER — Encounter: Payer: Self-pay | Admitting: Internal Medicine

## 2017-11-26 ENCOUNTER — Other Ambulatory Visit: Payer: Self-pay | Admitting: Family Medicine

## 2017-11-26 DIAGNOSIS — K219 Gastro-esophageal reflux disease without esophagitis: Secondary | ICD-10-CM

## 2017-12-14 ENCOUNTER — Other Ambulatory Visit: Payer: Self-pay | Admitting: Women's Health

## 2017-12-14 DIAGNOSIS — Z1231 Encounter for screening mammogram for malignant neoplasm of breast: Secondary | ICD-10-CM

## 2017-12-17 ENCOUNTER — Ambulatory Visit
Admission: RE | Admit: 2017-12-17 | Discharge: 2017-12-17 | Disposition: A | Discharge status: Home / Self Care | Payer: Managed Care, Other (non HMO) | Source: Ambulatory Visit | Attending: Women's Health | Admitting: Women's Health

## 2017-12-17 DIAGNOSIS — Z1231 Encounter for screening mammogram for malignant neoplasm of breast: Secondary | ICD-10-CM

## 2018-01-13 ENCOUNTER — Encounter: Payer: Self-pay | Admitting: Family Medicine

## 2018-01-13 ENCOUNTER — Ambulatory Visit (INDEPENDENT_AMBULATORY_CARE_PROVIDER_SITE_OTHER): Payer: Managed Care, Other (non HMO) | Admitting: Family Medicine

## 2018-01-13 VITALS — BP 122/80 | HR 62 | Temp 98.2°F | Resp 16 | Ht 67.0 in | Wt 196.6 lb

## 2018-01-13 DIAGNOSIS — Z0000 Encounter for general adult medical examination without abnormal findings: Secondary | ICD-10-CM | POA: Diagnosis not present

## 2018-01-13 DIAGNOSIS — K219 Gastro-esophageal reflux disease without esophagitis: Secondary | ICD-10-CM | POA: Diagnosis not present

## 2018-01-13 LAB — COMPREHENSIVE METABOLIC PANEL
ALT: 16 U/L (ref 0–35)
AST: 18 U/L (ref 0–37)
Albumin: 4.4 g/dL (ref 3.5–5.2)
Alkaline Phosphatase: 84 U/L (ref 39–117)
BUN: 10 mg/dL (ref 6–23)
CHLORIDE: 105 meq/L (ref 96–112)
CO2: 28 mEq/L (ref 19–32)
Calcium: 10.3 mg/dL (ref 8.4–10.5)
Creatinine, Ser: 0.96 mg/dL (ref 0.40–1.20)
GFR: 76.25 mL/min (ref >60.00)
Glucose, Bld: 85 mg/dL (ref 70–99)
Potassium: 4.5 mEq/L (ref 3.5–5.1)
Sodium: 140 mEq/L (ref 135–145)
Total Bilirubin: 0.9 mg/dL (ref 0.2–1.2)
Total Protein: 7.1 g/dL (ref 6.0–8.3)

## 2018-01-13 LAB — CBC WITH DIFFERENTIAL/PLATELET
Basophils Absolute: 0 10*3/uL (ref 0.0–0.1)
Basophils Relative: 0.3 % (ref 0.0–3.0)
Eosinophils Absolute: 0.1 10*3/uL (ref 0.0–0.7)
Eosinophils Relative: 2.5 % (ref 0.0–5.0)
HEMATOCRIT: 37.8 % (ref 36.0–46.0)
HEMOGLOBIN: 12.7 g/dL (ref 12.0–15.0)
Lymphocytes Relative: 49.4 % — ABNORMAL HIGH (ref 12.0–46.0)
Lymphs Abs: 2.3 10*3/uL (ref 0.7–4.0)
MCHC: 33.5 g/dL (ref 30.0–36.0)
MCV: 89.1 fl (ref 78.0–100.0)
Monocytes Absolute: 0.4 10*3/uL (ref 0.1–1.0)
Monocytes Relative: 7.9 % (ref 3.0–12.0)
Neutro Abs: 1.8 10*3/uL (ref 1.4–7.7)
Neutrophils Relative %: 39.9 % — ABNORMAL LOW (ref 43.0–77.0)
Platelets: 239 10*3/uL (ref 150.0–400.0)
RBC: 4.24 Mil/uL (ref 3.87–5.11)
RDW: 13.9 % (ref 11.5–15.5)
WBC: 4.6 10*3/uL (ref 4.0–10.5)

## 2018-01-13 LAB — TSH: TSH: 1.71 u[IU]/mL (ref 0.35–4.50)

## 2018-01-13 LAB — LIPID PANEL
Cholesterol: 233 mg/dL — ABNORMAL HIGH (ref 0–200)
HDL: 53.8 mg/dL (ref >39.00)
LDL CALC: 159 mg/dL — AB (ref 0–99)
NonHDL: 179.32
Total CHOL/HDL Ratio: 4
Triglycerides: 103 mg/dL (ref 0.0–149.0)
VLDL: 20.6 mg/dL (ref 0.0–40.0)

## 2018-01-13 MED ORDER — OMEPRAZOLE 20 MG PO CPDR: 20.0000 mg | DELAYED_RELEASE_CAPSULE | Freq: Every day | ORAL | 3 refills | Status: DC

## 2018-01-13 NOTE — Patient Instructions (Signed)
Preventive Care 40-64 Years, Female Preventive care refers to lifestyle choices and visits with your health care provider that can promote health and wellness. What does preventive care include?   A yearly physical exam. This is also called an annual well check.  Dental exams once or twice a year.  Routine eye exams. Ask your health care provider how often you should have your eyes checked.  Personal lifestyle choices, including: ? Daily care of your teeth and gums. ? Regular physical activity. ? Eating a healthy diet. ? Avoiding tobacco and drug use. ? Limiting alcohol use. ? Practicing safe sex. ? Taking low-dose aspirin daily starting at age 50. ? Taking vitamin and mineral supplements as recommended by your health care provider. What happens during an annual well check? The services and screenings done by your health care provider during your annual well check will depend on your age, overall health, lifestyle risk factors, and family history of disease. Counseling Your health care provider may ask you questions about your:  Alcohol use.  Tobacco use.  Drug use.  Emotional well-being.  Home and relationship well-being.  Sexual activity.  Eating habits.  Work and work environment.  Method of birth control.  Menstrual cycle.  Pregnancy history. Screening You may have the following tests or measurements:  Height, weight, and BMI.  Blood pressure.  Lipid and cholesterol levels. These may be checked every 5 years, or more frequently if you are over 50 years old.  Skin check.  Lung cancer screening. You may have this screening every year starting at age 55 if you have a 30-pack-year history of smoking and currently smoke or have quit within the past 15 years.  Colorectal cancer screening. All adults should have this screening starting at age 50 and continuing until age 75. Your health care provider may recommend screening at age 45. You will have tests every  1-10 years, depending on your results and the type of screening test. People at increased risk should start screening at an earlier age. Screening tests may include: ? Guaiac-based fecal occult blood testing. ? Fecal immunochemical test (FIT). ? Stool DNA test. ? Virtual colonoscopy. ? Sigmoidoscopy. During this test, a flexible tube with a tiny camera (sigmoidoscope) is used to examine your rectum and lower colon. The sigmoidoscope is inserted through your anus into your rectum and lower colon. ? Colonoscopy. During this test, a long, thin, flexible tube with a tiny camera (colonoscope) is used to examine your entire colon and rectum.  Hepatitis C blood test.  Hepatitis B blood test.  Sexually transmitted disease (STD) testing.  Diabetes screening. This is done by checking your blood sugar (glucose) after you have not eaten for a while (fasting). You may have this done every 1-3 years.  Mammogram. This may be done every 1-2 years. Talk to your health care provider about when you should start having regular mammograms. This may depend on whether you have a family history of breast cancer.  BRCA-related cancer screening. This may be done if you have a family history of breast, ovarian, tubal, or peritoneal cancers.  Pelvic exam and Pap test. This may be done every 3 years starting at age 21. Starting at age 30, this may be done every 5 years if you have a Pap test in combination with an HPV test.  Bone density scan. This is done to screen for osteoporosis. You may have this scan if you are at high risk for osteoporosis. Discuss your test results, treatment options,   and if necessary, the need for more tests with your health care provider. Vaccines Your health care provider may recommend certain vaccines, such as:  Influenza vaccine. This is recommended every year.  Tetanus, diphtheria, and acellular pertussis (Tdap, Td) vaccine. You may need a Td booster every 10 years.  Varicella  vaccine. You may need this if you have not been vaccinated.  Zoster vaccine. You may need this after age 38.  Measles, mumps, and rubella (MMR) vaccine. You may need at least one dose of MMR if you were born in 1957 or later. You may also need a second dose.  Pneumococcal 13-valent conjugate (PCV13) vaccine. You may need this if you have certain conditions and were not previously vaccinated.  Pneumococcal polysaccharide (PPSV23) vaccine. You may need one or two doses if you smoke cigarettes or if you have certain conditions.  Meningococcal vaccine. You may need this if you have certain conditions.  Hepatitis A vaccine. You may need this if you have certain conditions or if you travel or work in places where you may be exposed to hepatitis A.  Hepatitis B vaccine. You may need this if you have certain conditions or if you travel or work in places where you may be exposed to hepatitis B.  Haemophilus influenzae type b (Hib) vaccine. You may need this if you have certain conditions. Talk to your health care provider about which screenings and vaccines you need and how often you need them. This information is not intended to replace advice given to you by your health care provider. Make sure you discuss any questions you have with your health care provider. Document Released: 02/08/2015 Document Revised: 03/04/2017 Document Reviewed: 11/13/2014 Elsevier Interactive Patient Education  2019 [employer].

## 2018-01-13 NOTE — Progress Notes (Signed)
Subjective:     Gwendolyn Weaver is a 57 y.o. female and is here for a comprehensive physical exam. The patient reports no problems.  Social History   Socioeconomic History  . Marital status: Married    Spouse name: Not on file  . Number of children: Not on file  . Years of education: Not on file  . Highest education level: Not on file  Occupational History  . Occupation: Environmental education officer  . Financial resource strain: Not on file  . Food insecurity:    Worry: Not on file    Inability: Not on file  . Transportation needs:    Medical: Not on file    Non-medical: Not on file  Tobacco Use  . Smoking status: Never Smoker  . Smokeless tobacco: Never Used  Substance and Sexual Activity  . Alcohol use: No  . Drug use: No  . Sexual activity: Yes    Birth control/protection: None  Lifestyle  . Physical activity:    Days per week: Not on file    Minutes per session: Not on file  . Stress: Not on file  Relationships  . Social connections:    Talks on phone: Not on file    Gets together: Not on file    Attends religious service: Not on file    Active member of club or organization: Not on file    Attends meetings of clubs or organizations: Not on file    Relationship status: Not on file  . Intimate partner violence:    Fear of current or ex partner: Not on file    Emotionally abused: Not on file    Physically abused: Not on file    Forced sexual activity: Not on file  Other Topics Concern  . Not on file  Social History Narrative   Exercise-2 days week   Health Maintenance  Topic Date Due  . Hepatitis C Screening  01/09/2028 (Originally 20-Jan-1961)  . HIV Screening  01/09/2028 (Originally 01/29/1975)  . COLONOSCOPY  10/27/2018  . MAMMOGRAM  12/18/2018  . TETANUS/TDAP  01/09/2027  . INFLUENZA VACCINE  Completed  . 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 She does not have any pertinent problems on file. 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 Her family history includes Cancer (age of onset: 40) in her father; Hypertension in her father; Other in her father. She  reports that she has never smoked. She has never used smokeless tobacco. She reports that she does not drink alcohol or use drugs. She has a current medication list which includes the following prescription(s): acetaminophen, biotin, fiber, flaxseed (linseed), multivitamin, omeprazole, and vitamin d. Current Outpatient Medications on File Prior to Visit  Medication Sig Dispense Refill  . acetaminophen (TYLENOL) 500 MG tablet Take 1,000 mg by mouth every 6 (six) hours as needed (headache).    Marland Kitchen BIOTIN PO Take 1 capsule by mouth daily.    Marland Kitchen FIBER PO Take 1 tablet by mouth daily.    . Flaxseed, Linseed, (FLAX SEED OIL PO) Take  2 tablets by mouth daily.     . Multiple Vitamin (MULTIVITAMIN) tablet Take 1 tablet by mouth daily.     Marland Kitchen VITAMIN D PO Take 3 capsules by mouth daily.     No current facility-administered medications on file prior to visit.    She has No Known Allergies..  Review of Systems   Objective:    BP 122/80 (BP Location: Left Arm, Cuff Size: Normal)   Pulse 62   Temp 98.2 F (36.8 C) (Oral)   Resp 16   Ht 5\' 7"  (1.702 m)   Wt 196 lb 9.6 oz (89.2 kg)   LMP 09/28/2013   SpO2 99%   BMI 30.79 kg/m  General appearance: alert, cooperative, appears  stated age and no distress Head: Normocephalic, without obvious abnormality, atraumatic Eyes: conjunctivae/corneas clear. PERRL, EOM's intact. Fundi benign. Ears: normal TM's and external ear canals both ears Nose: Nares normal. Septum midline. Mucosa normal. No drainage or sinus tenderness. Throat: lips, mucosa, and tongue normal; teeth and gums normal Neck: no adenopathy, no carotid bruit, no JVD, supple, symmetrical, trachea midline and thyroid not enlarged, symmetric, no tenderness/mass/nodules Back: symmetric, no curvature. ROM normal. No CVA tenderness. Lungs: clear to auscultation bilaterally Breasts: normal appearance, no masses or tenderness Heart: regular rate and rhythm, S1, S2 normal, no murmur, click, rub or gallop Abdomen: soft, non-tender; bowel sounds normal; no masses,  no organomegaly Pelvic: not indicated; status post hysterectomy, negative ROS Extremities: extremities normal, atraumatic, no cyanosis or edema Pulses: 2+ and symmetric Skin: Skin color, texture, turgor normal. No rashes or lesions Lymph nodes: Cervical, supraclavicular, and axillary nodes normal. Neurologic: Alert and oriented X 3, normal strength and tone. Normal symmetric reflexes. Normal coordination and gait    Assessment:    Healthy female exam.      Plan:    ghm utd Check labs  See After Visit Summary for Counseling Recommendations

## 2018-01-18 ENCOUNTER — Other Ambulatory Visit: Payer: Self-pay | Admitting: *Deleted

## 2018-01-18 DIAGNOSIS — E785 Hyperlipidemia, unspecified: Secondary | ICD-10-CM

## 2018-10-21 ENCOUNTER — Encounter: Payer: Self-pay | Admitting: Family Medicine

## 2018-10-25 ENCOUNTER — Encounter: Payer: Self-pay | Admitting: Gynecology

## 2018-11-02 ENCOUNTER — Encounter: Payer: Self-pay | Admitting: Gastroenterology

## 2018-11-07 ENCOUNTER — Encounter: Payer: Self-pay | Admitting: Gastroenterology

## 2018-11-08 ENCOUNTER — Other Ambulatory Visit: Payer: Self-pay | Admitting: Family Medicine

## 2018-11-08 DIAGNOSIS — Z1231 Encounter for screening mammogram for malignant neoplasm of breast: Secondary | ICD-10-CM

## 2018-11-23 ENCOUNTER — Other Ambulatory Visit: Payer: Self-pay | Admitting: Family Medicine

## 2018-11-23 DIAGNOSIS — K219 Gastro-esophageal reflux disease without esophagitis: Secondary | ICD-10-CM

## 2018-11-24 ENCOUNTER — Ambulatory Visit (AMBULATORY_SURGERY_CENTER): Payer: Self-pay

## 2018-11-24 ENCOUNTER — Other Ambulatory Visit: Payer: Self-pay

## 2018-11-24 VITALS — Temp 96.6°F | Ht 67.0 in | Wt 199.6 lb

## 2018-11-24 DIAGNOSIS — Z1211 Encounter for screening for malignant neoplasm of colon: Secondary | ICD-10-CM

## 2018-11-24 MED ORDER — NA SULFATE-K SULFATE-MG SULF 17.5-3.13-1.6 GM/177ML PO SOLN: 1.0000 | Freq: Once | ORAL | 0 refills | Status: AC

## 2018-11-24 NOTE — Progress Notes (Signed)
Denies allergies to eggs or soy products. Denies complication of anesthesia or sedation. Denies use of weight loss medication. Denies use of O2.   Emmi instructions given for colonoscopy.  Covid Screening is scheduled for Monday 12/05/18 @ 8:25 Am. A 15.00 coupon for Suprep was given to the patient.

## 2018-11-25 ENCOUNTER — Encounter: Payer: Self-pay | Admitting: Gastroenterology

## 2018-12-05 ENCOUNTER — Other Ambulatory Visit: Payer: Self-pay | Admitting: Gastroenterology

## 2018-12-06 LAB — SARS CORONAVIRUS 2 (TAT 6-24 HRS): SARS Coronavirus 2: NEGATIVE

## 2018-12-08 ENCOUNTER — Ambulatory Visit (AMBULATORY_SURGERY_CENTER): Payer: Managed Care, Other (non HMO) | Admitting: Gastroenterology

## 2018-12-08 ENCOUNTER — Other Ambulatory Visit: Payer: Self-pay

## 2018-12-08 ENCOUNTER — Encounter: Payer: Self-pay | Admitting: Gastroenterology

## 2018-12-08 VITALS — BP 115/76 | HR 62 | Temp 97.8°F | Resp 19 | Ht 67.0 in | Wt 199.0 lb

## 2018-12-08 DIAGNOSIS — D120 Benign neoplasm of cecum: Secondary | ICD-10-CM | POA: Diagnosis not present

## 2018-12-08 DIAGNOSIS — Z1211 Encounter for screening for malignant neoplasm of colon: Secondary | ICD-10-CM

## 2018-12-08 MED ORDER — SODIUM CHLORIDE 0.9 % IV SOLN: 500.0000 mL | Freq: Once | INTRAVENOUS | Status: DC

## 2018-12-08 NOTE — Progress Notes (Signed)
PT taken to PACU. Monitors in place. VSS. Report given to RN. 

## 2018-12-08 NOTE — Progress Notes (Signed)
Called to room to assist during endoscopic procedure.  Patient ID and intended procedure confirmed with present staff. Received instructions for my participation in the procedure from the performing physician.  

## 2018-12-08 NOTE — Patient Instructions (Signed)
Discharge instructions given. Handout on polyps. Resume previous medications. YOU HAD AN ENDOSCOPIC PROCEDURE TODAY AT THE [facility]:   Refer to the procedure report that was given to you for any specific questions about what was found during the examination.  If the procedure report does not answer your questions, please call your gastroenterologist to clarify.  If you requested that your care partner not be given the details of your procedure findings, then the procedure report has been included in a sealed envelope for you to review at your convenience later.  YOU SHOULD EXPECT: Some feelings of bloating in the abdomen. Passage of more gas than usual.  Walking can help get rid of the air that was put into your GI tract during the procedure and reduce the bloating. If you had a lower endoscopy (such as a colonoscopy or flexible sigmoidoscopy) you may notice spotting of blood in your stool or on the toilet paper. If you underwent a bowel prep for your procedure, you may not have a normal bowel movement for a few days.  Please Note:  You might notice some irritation and congestion in your nose or some drainage.  This is from the oxygen used during your procedure.  There is no need for concern and it should clear up in a day or so.  SYMPTOMS TO REPORT IMMEDIATELY:   Following lower endoscopy (colonoscopy or flexible sigmoidoscopy):  Excessive amounts of blood in the stool  Significant tenderness or worsening of abdominal pains  Swelling of the abdomen that is new, acute  Fever of 100F or higher   For urgent or emergent issues, a gastroenterologist can be reached at any hour by calling [phone].   DIET:  We do recommend a small meal at first, but then you may proceed to your regular diet.  Drink plenty of fluids but you should avoid alcoholic beverages for 24 hours.  ACTIVITY:  You should plan to take it easy for the rest of today and you should NOT DRIVE or use heavy  machinery until tomorrow (because of the sedation medicines used during the test).    FOLLOW UP: Our staff will call the number listed on your records 48-72 hours following your procedure to check on you and address any questions or concerns that you may have regarding the information given to you following your procedure. If we do not reach you, we will leave a message.  We will attempt to reach you two times.  During this call, we will ask if you have developed any symptoms of COVID 19. If you develop any symptoms (ie: fever, flu-like symptoms, shortness of breath, cough etc.) before then, please call [phone].  If you test positive for Covid 19 in the 2 weeks post procedure, please call and report this information to us.    If any biopsies were taken you will be contacted by phone or by letter within the next 1-3 weeks.  Please call us at [phone] if you have not heard about the biopsies in 3 weeks.    SIGNATURES/CONFIDENTIALITY: You and/or your care partner have signed paperwork which will be entered into your electronic medical record.  These signatures attest to the fact that that the information above on your After Visit Summary has been reviewed and is understood.  Full responsibility of the confidentiality of this discharge information lies with you and/or your care-partner. 

## 2018-12-08 NOTE — Op Note (Signed)
[facility] Patient Name: Gwendolyn Weaver Procedure Date: 12/08/2018 9:30 AM MRN: [id number] Endoscopist: Mallie Mussel L. Loletha Carrow , MD Age: 58 Referring MD:  Date of Birth: 15-Apr-1960 Gender: Female Account #: [id number] Procedure:                Colonoscopy Indications:              Screening for colorectal malignant neoplasm (no                            polyps 10/2008) Medicines:                Monitored Anesthesia Care Procedure:                Pre-Anesthesia Assessment:                           - Prior to the procedure, a History and Physical                            was performed, and patient medications and                            allergies were reviewed. The patient's tolerance of                            previous anesthesia was also reviewed. The risks                            and benefits of the procedure and the sedation                            options and risks were discussed with the patient.                            All questions were answered, and informed consent                            was obtained. Prior Anticoagulants: The patient has                            taken no previous anticoagulant or antiplatelet                            agents. ASA Grade Assessment: II - A patient with                            mild systemic disease. After reviewing the risks                            and benefits, the patient was deemed in                            satisfactory condition to undergo the procedure.  After obtaining informed consent, the colonoscope                            was passed under direct vision. Throughout the                            procedure, the patient's blood pressure, pulse, and                            oxygen saturations were monitored continuously. The                            Colonoscope was introduced through the anus and                            advanced to the the cecum, identified by                           appendiceal orifice and ileocecal valve. The                            colonoscopy was somewhat difficult due to a                            redundant colon. Successful completion of the                            procedure was aided by using manual pressure. The                            patient tolerated the procedure well. The quality                            of the bowel preparation was excellent. The                            ileocecal valve, appendiceal orifice, and rectum                            were photographed. Scope In: 9:39:11 AM Scope Out: 9:56:01 AM Scope Withdrawal Time: 0 hours 5 minutes 52 seconds  Total Procedure Duration: 0 hours 16 minutes 50 seconds  Findings:                 The perianal and digital rectal examinations were                            normal.                           A diminutive polyp was found in the cecum. The                            polyp was sessile. The polyp was removed with a  cold biopsy forceps. Resection and retrieval were                            complete.                           The exam was otherwise without abnormality on                            direct and retroflexion views. Complications:            No immediate complications. Estimated Blood Loss:     Estimated blood loss was minimal. Impression:               - One diminutive polyp in the cecum, removed with a                            cold biopsy forceps. Resected and retrieved.                           - The examination was otherwise normal on direct                            and retroflexion views. Recommendation:           - Patient has a contact number available for                            emergencies. The signs and symptoms of potential                            delayed complications were discussed with the                            patient. Return to normal activities tomorrow.                             Written discharge instructions were provided to the                            patient.                           - Resume previous diet.                           - Continue present medications.                           - Await pathology results.                           - Repeat colonoscopy is recommended for                            surveillance. The colonoscopy date will be  determined after pathology results from today's                            exam become available for review. Donnette Macmullen L. Loletha Carrow, MD 12/08/2018 10:01:35 AM This report has been signed electronically.

## 2018-12-12 ENCOUNTER — Telehealth: Payer: Self-pay | Admitting: *Deleted

## 2018-12-12 NOTE — Telephone Encounter (Signed)
1. Have you developed a fever since your procedure? no  2.   Have you had an respiratory symptoms (SOB or cough) since your procedure? no  3.   Have you tested positive for COVID 19 since your procedure no  4.   Have you had any family members/close contacts diagnosed with the COVID 19 since your procedure?  no   If yes to any of these questions please route to Joylene John, RN and Alphonsa Gin, Therapist, sports.  Follow up Call-  Call back number 12/08/2018  Post procedure Call Back phone  # [phone]  Permission to leave phone message Yes  Some recent data might be hidden     Patient questions:  Do you have a fever, pain , or abdominal swelling? No. Pain Score  0 *  Have you tolerated food without any problems? Yes.    Have you been able to return to your normal activities? Yes.    Do you have any questions about your discharge instructions: Diet   No. Medications  No. Follow up visit  No.  Do you have questions or concerns about your Care? No.  Actions: * If pain score is 4 or above: No action needed, pain <4.

## 2018-12-26 ENCOUNTER — Encounter: Payer: Self-pay | Admitting: Gastroenterology

## 2018-12-26 ENCOUNTER — Ambulatory Visit
Admission: RE | Admit: 2018-12-26 | Discharge: 2018-12-26 | Disposition: A | Discharge status: Home / Self Care | Payer: Managed Care, Other (non HMO) | Source: Ambulatory Visit | Attending: Family Medicine | Admitting: Family Medicine

## 2018-12-26 ENCOUNTER — Other Ambulatory Visit: Payer: Self-pay

## 2018-12-26 DIAGNOSIS — Z1231 Encounter for screening mammogram for malignant neoplasm of breast: Secondary | ICD-10-CM

## 2019-01-13 ENCOUNTER — Other Ambulatory Visit: Payer: Self-pay

## 2019-01-16 ENCOUNTER — Encounter: Payer: Self-pay | Admitting: Family Medicine

## 2019-01-16 ENCOUNTER — Other Ambulatory Visit: Payer: Self-pay

## 2019-01-16 ENCOUNTER — Ambulatory Visit (INDEPENDENT_AMBULATORY_CARE_PROVIDER_SITE_OTHER): Payer: Managed Care, Other (non HMO) | Admitting: Family Medicine

## 2019-01-16 VITALS — BP 118/80 | HR 65 | Temp 97.3°F | Resp 18 | Ht 67.0 in | Wt 199.4 lb

## 2019-01-16 DIAGNOSIS — Z23 Encounter for immunization: Secondary | ICD-10-CM

## 2019-01-16 DIAGNOSIS — Z0000 Encounter for general adult medical examination without abnormal findings: Secondary | ICD-10-CM | POA: Diagnosis not present

## 2019-01-16 LAB — LIPID PANEL
Cholesterol: 207 mg/dL — ABNORMAL HIGH (ref 0–200)
HDL: 46.4 mg/dL (ref >39.00)
LDL Cholesterol: 132 mg/dL — ABNORMAL HIGH (ref 0–99)
NonHDL: 160.87
Total CHOL/HDL Ratio: 4
Triglycerides: 146 mg/dL (ref 0.0–149.0)
VLDL: 29.2 mg/dL (ref 0.0–40.0)

## 2019-01-16 LAB — COMPREHENSIVE METABOLIC PANEL
ALT: 18 U/L (ref 0–35)
AST: 22 U/L (ref 0–37)
Albumin: 3.6 g/dL (ref 3.5–5.2)
Alkaline Phosphatase: 55 U/L (ref 39–117)
BUN: 11 mg/dL (ref 6–23)
CO2: 27 mEq/L (ref 19–32)
Calcium: 9.3 mg/dL (ref 8.4–10.5)
Chloride: 108 mEq/L (ref 96–112)
Creatinine, Ser: 1.05 mg/dL (ref 0.40–1.20)
GFR: 64.48 mL/min (ref >60.00)
Glucose, Bld: 86 mg/dL (ref 70–99)
Potassium: 3.8 mEq/L (ref 3.5–5.1)
Sodium: 142 mEq/L (ref 135–145)
Total Bilirubin: 0.8 mg/dL (ref 0.2–1.2)
Total Protein: 5.5 g/dL — ABNORMAL LOW (ref 6.0–8.3)

## 2019-01-16 LAB — CBC WITH DIFFERENTIAL/PLATELET
Basophils Absolute: 0 10*3/uL (ref 0.0–0.1)
Basophils Relative: 0.4 % (ref 0.0–3.0)
Eosinophils Absolute: 0.1 10*3/uL (ref 0.0–0.7)
Eosinophils Relative: 2.4 % (ref 0.0–5.0)
HCT: 39.9 % (ref 36.0–46.0)
Hemoglobin: 13.3 g/dL (ref 12.0–15.0)
Lymphocytes Relative: 53.3 % — ABNORMAL HIGH (ref 12.0–46.0)
Lymphs Abs: 2.2 10*3/uL (ref 0.7–4.0)
MCHC: 33.3 g/dL (ref 30.0–36.0)
MCV: 91.8 fl (ref 78.0–100.0)
Monocytes Absolute: 0.3 10*3/uL (ref 0.1–1.0)
Monocytes Relative: 7.2 % (ref 3.0–12.0)
Neutro Abs: 1.5 10*3/uL (ref 1.4–7.7)
Neutrophils Relative %: 36.7 % — ABNORMAL LOW (ref 43.0–77.0)
Platelets: 230 10*3/uL (ref 150.0–400.0)
RBC: 4.35 Mil/uL (ref 3.87–5.11)
RDW: 13.5 % (ref 11.5–15.5)
WBC: 4.1 10*3/uL (ref 4.0–10.5)

## 2019-01-16 LAB — TSH: TSH: 2.03 u[IU]/mL (ref 0.35–4.50)

## 2019-01-16 NOTE — Patient Instructions (Signed)

## 2019-01-16 NOTE — Progress Notes (Signed)
Subjective:     Gwendolyn Weaver is a 58 y.o. female and is here for a comprehensive physical exam. The patient reports no problems.  Social History   Socioeconomic History  . Marital status: Married    Spouse name: Not on file  . Number of children: Not on file  . Years of education: Not on file  . Highest education level: Not on file  Occupational History  . Occupation: Sports coach  Tobacco Use  . Smoking status: Never Smoker  . Smokeless tobacco: Never Used  Substance and Sexual Activity  . Alcohol use: No  . Drug use: No  . Sexual activity: Yes    Birth control/protection: None  Other Topics Concern  . Not on file  Social History Narrative   Exercise-2 days week   Social Determinants of Health   Financial Resource Strain:   . Difficulty of Paying Living Expenses: Not on file  Food Insecurity:   . Worried About Charity fundraiser in the Last Year: Not on file  . Ran Out of Food in the Last Year: Not on file  Transportation Needs:   . Lack of Transportation (Medical): Not on file  . Lack of Transportation (Non-Medical): Not on file  Physical Activity:   . Days of Exercise per Week: Not on file  . Minutes of Exercise per Session: Not on file  Stress:   . Feeling of Stress : Not on file  Social Connections:   . Frequency of Communication with Friends and Family: Not on file  . Frequency of Social Gatherings with Friends and Family: Not on file  . Attends Religious Services: Not on file  . Active Member of Clubs or Organizations: Not on file  . Attends Archivist Meetings: Not on file  . Marital Status: Not on file  Intimate Partner Violence:   . Fear of Current or Ex-Partner: Not on file  . Emotionally Abused: Not on file  . Physically Abused: Not on file  . Sexually Abused: Not on file   Health Maintenance  Topic Date Due  . Hepatitis C Screening  01/09/2028 (Originally 07-Jan-1961)  . HIV Screening  01/09/2028 (Originally 01/29/1973)  . MAMMOGRAM   12/26/2019  . COLONOSCOPY  12/07/2025  . TETANUS/TDAP  01/09/2027  . INFLUENZA VACCINE  Completed  . 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 She does not have any pertinent problems on file. 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 Her family history includes Hypertension in her father; Other in her father; Prostate cancer (age of onset: 25) in her father. She  reports that she has never smoked. She has never used smokeless tobacco. She reports that she does not drink alcohol or use drugs. She has a current medication list which includes the following prescription(s): acetaminophen, biotin, fiber, flaxseed (linseed), multivitamin, omeprazole, OVER THE COUNTER MEDICATION, and vitamin d. Current Outpatient Medications on File Prior to Visit  Medication Sig Dispense Refill  . acetaminophen (TYLENOL) 500 MG tablet Take 1,000 mg by  mouth every 6 (six) hours as needed (headache).    Marland Kitchen BIOTIN PO Take 1 capsule by mouth daily.    Marland Kitchen FIBER PO Take 1 tablet by mouth daily.    . Flaxseed, Linseed, (FLAX SEED OIL PO) Take 3 tablets by mouth daily.     . Multiple Vitamin (MULTIVITAMIN) tablet Take 1 tablet by mouth daily.     Marland Kitchen omeprazole (PRILOSEC) 20 MG capsule TAKE 1 CAPSULE BY MOUTH DAILY 90 capsule 1  . OVER THE COUNTER MEDICATION  Iron 65 mg. One two tablets daily.    Marland Kitchen VITAMIN D PO Take 3 capsules by mouth daily.     No current facility-administered medications on file prior to visit.   She has No Known Allergies..  Review of Systems Review of Systems  Constitutional: Negative for activity change, appetite change and fatigue.  HENT: Negative for hearing loss, congestion, tinnitus and ear discharge.  dentist q83m Eyes: Negative for visual disturbance (see optho q1y -- vision corrected to 20/20 with glasses).  Respiratory: Negative for cough, chest tightness and shortness of breath.   Cardiovascular: Negative for chest pain, palpitations and leg swelling.  Gastrointestinal: Negative for abdominal pain, diarrhea, constipation and abdominal distention.  Genitourinary: Negative for urgency, frequency, decreased urine volume and difficulty urinating.  Musculoskeletal: Negative for back pain, arthralgias and gait problem.  Skin: Negative for color change, pallor and rash.  Neurological: Negative for dizziness, light-headedness, numbness and headaches.  Hematological: Negative for adenopathy. Does not bruise/bleed easily.  Psychiatric/Behavioral: Negative for suicidal ideas, confusion, sleep disturbance, self-injury, dysphoric mood, decreased concentration and agitation.       Objective:    BP 118/80 (BP Location: Right Arm, Patient Position: Sitting, Cuff Size: Normal)   Pulse 65   Temp (!) 97.3 F (36.3 C) (Temporal)   Resp 18   Ht 5\' 7"  (1.702 m)   Wt 199 lb 6.4 oz (90.4 kg)   LMP 09/28/2013   SpO2 97%   BMI 31.23 kg/m  General appearance: alert, cooperative, appears stated age and no distress Head: Normocephalic, without obvious abnormality, atraumatic Eyes: conjunctivae/corneas clear. PERRL, EOM's intact. Fundi benign. Ears: normal TM's and external ear canals both ears Nose: Nares normal. Septum midline. Mucosa normal. No drainage or sinus tenderness. Throat: lips, mucosa, and tongue normal; teeth and  gums normal Neck: no adenopathy, no carotid bruit, no JVD, supple, symmetrical, trachea midline and thyroid not enlarged, symmetric, no tenderness/mass/nodules Back: symmetric, no curvature. ROM normal. No CVA tenderness. Lungs: clear to auscultation bilaterally Breasts: normal appearance, no masses or tenderness Heart: regular rate and rhythm, S1, S2 normal, no murmur, click, rub or gallop Abdomen: soft, non-tender; bowel sounds normal; no masses,  no organomegaly Pelvic: not indicated; status post hysterectomy, negative ROS Extremities: extremities normal, atraumatic, no cyanosis or edema Pulses: 2+ and symmetric Skin: Skin color, texture, turgor normal. No rashes or lesions Lymph nodes: Cervical, supraclavicular, and axillary nodes normal. Neurologic: Alert and oriented X 3, normal strength and tone. Normal symmetric reflexes. Normal coordination and gait    Assessment:    Healthy female exam.      Plan:    ghm utd Check labs  See After Visit Summary for Counseling Recommendations

## 2019-03-24 ENCOUNTER — Other Ambulatory Visit: Payer: Self-pay

## 2019-03-27 ENCOUNTER — Ambulatory Visit (INDEPENDENT_AMBULATORY_CARE_PROVIDER_SITE_OTHER): Payer: PRIVATE HEALTH INSURANCE | Admitting: Family Medicine

## 2019-03-27 ENCOUNTER — Encounter: Payer: Self-pay | Admitting: Family Medicine

## 2019-03-27 ENCOUNTER — Other Ambulatory Visit: Payer: Self-pay

## 2019-03-27 VITALS — BP 100/70 | HR 81 | Temp 97.9°F | Resp 18 | Ht 67.0 in | Wt 200.4 lb

## 2019-03-27 DIAGNOSIS — Z23 Encounter for immunization: Secondary | ICD-10-CM

## 2019-03-27 NOTE — Progress Notes (Signed)
1. Need for shingles vaccine - Varicella-zoster vaccine IM (Shingrix) shingrix given

## 2019-04-24 ENCOUNTER — Ambulatory Visit: Payer: PRIVATE HEALTH INSURANCE | Attending: Internal Medicine

## 2019-04-24 DIAGNOSIS — Z23 Encounter for immunization: Secondary | ICD-10-CM

## 2019-04-24 NOTE — Progress Notes (Signed)
   Covid-19 Vaccination Clinic  Name:  Gwendolyn Weaver    MRN: [id number] DOB: Jan 18, 1961  04/24/2019  [name] was observed post Covid-19 immunization for 15 minutes without incident. She was provided with Vaccine Information Sheet and instruction to access the V-Safe system.   [name] was instructed to call 911 with any severe reactions post vaccine: Marland Kitchen Difficulty breathing  . Swelling of face and throat  . A fast heartbeat  . A bad rash all over body  . Dizziness and weakness   Immunizations Administered    Name Date Dose VIS Date Route   Pfizer COVID-19 Vaccine 04/24/2019 11:37 AM 0.3 mL 01/06/2019 Intramuscular   Manufacturer: [place]   Lot: [id number]   [place]: [id number]

## 2019-05-17 ENCOUNTER — Ambulatory Visit: Payer: PRIVATE HEALTH INSURANCE | Attending: Internal Medicine

## 2019-05-17 DIAGNOSIS — Z23 Encounter for immunization: Secondary | ICD-10-CM

## 2019-05-17 NOTE — Progress Notes (Signed)
   Covid-19 Vaccination Clinic  Name:  Gwendolyn Weaver    MRN: [id number] DOB: [birth year]  05/17/2019  [name] was observed post Covid-19 immunization for 15 minutes without incident. She was provided with Vaccine Information Sheet and instruction to access the V-Safe system.   [name] was instructed to call 911 with any severe reactions post vaccine: Marland Kitchen Difficulty breathing  . Swelling of face and throat  . A fast heartbeat  . A bad rash all over body  . Dizziness and weakness   Immunizations Administered    Name Date Dose VIS Date Route   Pfizer COVID-19 Vaccine 05/17/2019  3:27 PM 0.3 mL 03/22/2018 Intramuscular   Manufacturer: [employer]   Lot: [id number]   [place]: [id number]

## 2019-06-01 ENCOUNTER — Ambulatory Visit (HOSPITAL_COMMUNITY)
Admission: EM | Admit: 2019-06-01 | Discharge: 2019-06-01 | Disposition: A | Discharge status: Home / Self Care | Payer: PRIVATE HEALTH INSURANCE | Attending: Family Medicine | Admitting: Family Medicine

## 2019-06-01 ENCOUNTER — Encounter (HOSPITAL_COMMUNITY): Payer: Self-pay

## 2019-06-01 ENCOUNTER — Other Ambulatory Visit: Payer: Self-pay

## 2019-06-01 DIAGNOSIS — S40862A Insect bite (nonvenomous) of left upper arm, initial encounter: Secondary | ICD-10-CM

## 2019-06-01 DIAGNOSIS — W57XXXA Bitten or stung by nonvenomous insect and other nonvenomous arthropods, initial encounter: Secondary | ICD-10-CM

## 2019-06-01 DIAGNOSIS — L03114 Cellulitis of left upper limb: Secondary | ICD-10-CM | POA: Diagnosis not present

## 2019-06-01 MED ORDER — METHYLPREDNISOLONE SODIUM SUCC 125 MG IJ SOLR
INTRAMUSCULAR | Status: AC
  Filled 2019-06-01: qty 2

## 2019-06-01 MED ORDER — METHYLPREDNISOLONE SODIUM SUCC 125 MG IJ SOLR
125.0000 mg | Freq: Once | INTRAMUSCULAR | Status: AC
  Administered 2019-06-01: 21:00:00 125 mg via INTRAMUSCULAR

## 2019-06-01 MED ORDER — DOXYCYCLINE HYCLATE 100 MG PO CAPS: 100.0000 mg | ORAL_CAPSULE | Freq: Two times a day (BID) | ORAL | 0 refills | Status: AC

## 2019-06-01 MED ORDER — PREDNISONE 50 MG PO TABS: 50.0000 mg | ORAL_TABLET | Freq: Every day | ORAL | 0 refills | Status: DC

## 2019-06-01 NOTE — ED Triage Notes (Signed)
Pt is here with a rash on her left arm that she noticed Saturday. Pt has taken Benadryl to relieve discomfort.
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
## 2019-07-18 ENCOUNTER — Other Ambulatory Visit: Payer: Self-pay | Admitting: Family Medicine

## 2019-07-18 DIAGNOSIS — K219 Gastro-esophageal reflux disease without esophagitis: Secondary | ICD-10-CM

## 2019-11-27 ENCOUNTER — Other Ambulatory Visit: Payer: Self-pay | Admitting: Family Medicine

## 2019-11-27 DIAGNOSIS — Z1231 Encounter for screening mammogram for malignant neoplasm of breast: Secondary | ICD-10-CM

## 2020-01-09 ENCOUNTER — Ambulatory Visit
Admission: RE | Admit: 2020-01-09 | Discharge: 2020-01-09 | Disposition: A | Discharge status: Home / Self Care | Payer: PRIVATE HEALTH INSURANCE | Source: Ambulatory Visit | Attending: Family Medicine | Admitting: Family Medicine

## 2020-01-09 ENCOUNTER — Other Ambulatory Visit: Payer: Self-pay

## 2020-01-09 DIAGNOSIS — Z1231 Encounter for screening mammogram for malignant neoplasm of breast: Secondary | ICD-10-CM

## 2020-06-19 ENCOUNTER — Other Ambulatory Visit: Payer: Self-pay | Admitting: Family Medicine

## 2020-06-19 DIAGNOSIS — K219 Gastro-esophageal reflux disease without esophagitis: Secondary | ICD-10-CM

## 2020-10-28 ENCOUNTER — Other Ambulatory Visit: Payer: Self-pay

## 2020-10-29 ENCOUNTER — Ambulatory Visit (INDEPENDENT_AMBULATORY_CARE_PROVIDER_SITE_OTHER): Payer: PRIVATE HEALTH INSURANCE | Admitting: Family Medicine

## 2020-10-29 ENCOUNTER — Encounter: Payer: Self-pay | Admitting: Family Medicine

## 2020-10-29 VITALS — BP 134/88 | HR 66 | Temp 98.1°F | Resp 18 | Ht 67.0 in | Wt 200.8 lb

## 2020-10-29 DIAGNOSIS — Z23 Encounter for immunization: Secondary | ICD-10-CM | POA: Diagnosis not present

## 2020-10-29 DIAGNOSIS — E559 Vitamin D deficiency, unspecified: Secondary | ICD-10-CM | POA: Diagnosis not present

## 2020-10-29 DIAGNOSIS — Z0000 Encounter for general adult medical examination without abnormal findings: Secondary | ICD-10-CM

## 2020-10-29 DIAGNOSIS — E2839 Other primary ovarian failure: Secondary | ICD-10-CM | POA: Diagnosis not present

## 2020-10-29 NOTE — Patient Instructions (Signed)
Preventive Care 40-62 Years Old, Female Preventive care refers to lifestyle choices and visits with your health care provider that can promote health and wellness. This includes: A yearly physical exam. This is also called an annual wellness visit. Regular dental and eye exams. Immunizations. Screening for certain conditions. Healthy lifestyle choices, such as: Eating a healthy diet. Getting regular exercise. Not using drugs or products that contain nicotine and tobacco. Limiting alcohol use. What can I expect for my preventive care visit? Physical exam Your health care provider will check your: Height and weight. These may be used to calculate your BMI (body mass index). BMI is a measurement that tells if you are at a healthy weight. Heart rate and blood pressure. Body temperature. Skin for abnormal spots. Counseling Your health care provider may ask you questions about your: Past medical problems. Family's medical history. Alcohol, tobacco, and drug use. Emotional well-being. Home life and relationship well-being. Sexual activity. Diet, exercise, and sleep habits. Work and work environment. Access to firearms. Method of birth control. Menstrual cycle. Pregnancy history. What immunizations do I need? Vaccines are usually given at various ages, according to a schedule. Your health care provider will recommend vaccines for you based on your age, medical history, and lifestyle or other factors, such as travel or where you work. What tests do I need? Blood tests Lipid and cholesterol levels. These may be checked every 5 years, or more often if you are over 50 years old. Hepatitis C test. Hepatitis B test. Screening Lung cancer screening. You may have this screening every year starting at age 55 if you have a 30-pack-year history of smoking and currently smoke or have quit within the past 15 years. Colorectal cancer screening. All adults should have this screening starting at  age 50 and continuing until age 75. Your health care provider may recommend screening at age 45 if you are at increased risk. You will have tests every 1-10 years, depending on your results and the type of screening test. Diabetes screening. This is done by checking your blood sugar (glucose) after you have not eaten for a while (fasting). You may have this done every 1-3 years. Mammogram. This may be done every 1-2 years. Talk with your health care provider about when you should start having regular mammograms. This may depend on whether you have a family history of breast cancer. BRCA-related cancer screening. This may be done if you have a family history of breast, ovarian, tubal, or peritoneal cancers. Pelvic exam and Pap test. This may be done every 3 years starting at age 21. Starting at age 30, this may be done every 5 years if you have a Pap test in combination with an HPV test. Other tests STD (sexually transmitted disease) testing, if you are at risk. Bone density scan. This is done to screen for osteoporosis. You may have this scan if you are at high risk for osteoporosis. Talk with your health care provider about your test results, treatment options, and if necessary, the need for more tests. Follow these instructions at home: Eating and drinking  Eat a diet that includes fresh fruits and vegetables, whole grains, lean protein, and low-fat dairy products. Take vitamin and mineral supplements as recommended by your health care provider. Do not drink alcohol if: Your health care provider tells you not to drink. You are pregnant, may be pregnant, or are planning to become pregnant. If you drink alcohol: Limit how much you have to 0-1 drink a day. Be   aware of how much alcohol is in your drink. In the [place] one drink equals one 12 oz bottle of beer (355 mL), one 5 oz glass of wine (148 mL), or one 1 oz glass of hard liquor (44 mL). Lifestyle Take daily care of your teeth and  gums. Brush your teeth every morning and night with fluoride toothpaste. Floss one time each day. Stay active. Exercise for at least 30 minutes 5 or more days each week. Do not use any products that contain nicotine or tobacco, such as cigarettes, e-cigarettes, and chewing tobacco. If you need help quitting, ask your health care provider. Do not use drugs. If you are sexually active, practice safe sex. Use a condom or other form of protection to prevent STIs (sexually transmitted infections). If you do not wish to become pregnant, use a form of birth control. If you plan to become pregnant, see your health care provider for a prepregnancy visit. If told by your health care provider, take low-dose aspirin daily starting at age 63. Find healthy ways to cope with stress, such as: Meditation, yoga, or listening to music. Journaling. Talking to a trusted person. Spending time with friends and family. Safety Always wear your seat belt while driving or riding in a vehicle. Do not drive: If you have been drinking alcohol. Do not ride with someone who has been drinking. When you are tired or distracted. While texting. Wear a helmet and other protective equipment during sports activities. If you have firearms in your house, make sure you follow all gun safety procedures. What's next? Visit your health care provider once a year for an annual wellness visit. Ask your health care provider how often you should have your eyes and teeth checked. Stay up to date on all vaccines. This information is not intended to replace advice given to you by your health care provider. Make sure you discuss any questions you have with your health care provider. Document Revised: 03/22/2020 Document Reviewed: 09/23/2017 Elsevier Patient Education  2022 [employer].

## 2020-10-29 NOTE — Progress Notes (Signed)
Subjective:   By signing my name below, I, Shehryar Baig, attest that this documentation has been prepared under the direction and in the presence of Dr. Roma Schanz, DO. 10/29/2020    Patient ID: Gwendolyn Weaver, female    DOB: 04-21-1960, 60 y.o.   MRN: [id number]  Chief Complaint  Patient presents with   Annual Exam    Pt states not fasting.    HPI Patient is in today for a comprehensive physical exam.  She reports of a lump on her lower neck for the past year. It is mainly non-painful unless she presses hard on it.  She denies having any fever, new moles, congestion, sore throat, muscle pain, joint pain, chest pain, cough, SOB, wheezing, n/v/d, constipation, blood in stool, dysuria, frequency, hematuria, or headaches at this time. She continues participating in exercise by walking.  She has no changes to her family medical history. She has no recent surgical procedures in the past year.  It has been 6 years since her hysterectomy. She is due for a bone density scan and is interested in setting up an appointment at the same time as her mammogram.  She is getting a flu vaccine during this visit. She has 3 pfizer Covid-19 vaccines at this time. She was informed of the new Covid-19 booster vaccine during this visit. She is UTD on tetanus vaccines.  She continues working at this time.    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    LGSIL ON BIOPSY   Sickle cell trait ([place])     Past Surgical History:  Procedure Laterality Date   ABDOMINAL HYSTERECTOMY     fibroids   CESAREAN SECTION     X2  BTL   CYSTOSCOPY N/A 02/13/2014   Procedure: CYSTOSCOPY;  Surgeon: Anastasio Auerbach, MD;  Location: [facility];  Service: Gynecology;  Laterality: N/A;   DILATATION & CURETTAGE/HYSTEROSCOPY WITH TRUECLEAR N/A 10/19/2013    Procedure: DILATATION & CURETTAGE/HYSTEROSCOPY ;  Surgeon: Anastasio Auerbach, MD;  Location: [facility];  Service: Gynecology;  Laterality: N/A;   DILATION AND CURETTAGE OF UTERUS     HEMORRHOID SURGERY     HEMORRHOID SURGERY  80'S   LAPAROSCOPIC ASSISTED VAGINAL HYSTERECTOMY N/A 02/13/2014   Procedure: LAPAROSCOPIC ASSISTED VAGINAL HYSTERECTOMY;  Surgeon: Anastasio Auerbach, MD;  Location: [facility];  Service: Gynecology;  Laterality: N/A;   LAPAROSCOPIC BILATERAL SALPINGO OOPHERECTOMY Bilateral 02/13/2014   Procedure: LAPAROSCOPIC BILATERAL SALPINGO OOPHORECTOMY;  Surgeon: Anastasio Auerbach, MD;  Location: [facility];  Service: Gynecology;  Laterality: Bilateral;   PARATHYROID EXPLORATION  12/2009   SMALL TUMOR REMOVED. BENIGN.    Family History  Problem Relation Age of Onset   Hypertension Father    Other Father        circulation issues - bilateral amputations   Prostate cancer Father 54   Asthma Mother    Colon cancer Neg Hx    Esophageal cancer Neg Hx    Rectal cancer Neg Hx    Stomach cancer Neg Hx     Social History   Socioeconomic History   Marital status: Married    Spouse name: Not on file   Number of children: Not on file   Years of education: Not on file   Highest education level: Not on file  Occupational History   Occupation: Sports coach  Tobacco Use   Smoking status: Never   Smokeless tobacco: Never  Substance and Sexual Activity   Alcohol use: No   Drug use: No   Sexual activity: Yes    Birth control/protection: None  Other Topics Concern   Not on file  Social History Narrative   Exercise-2 days week   Social Determinants of Health   Financial Resource Strain: Not on file  Food Insecurity: Not on file  Transportation Needs: Not on file  Physical Activity: Not on file  Stress: Not on file  Social Connections: Not on file  Intimate Partner Violence: Not on file    Outpatient Medications Prior to Visit  Medication Sig Dispense Refill   acetaminophen  (TYLENOL) 500 MG tablet Take 1,000 mg by mouth every 6 (six) hours as needed (headache).     BIOTIN PO Take 1 capsule by mouth daily.     FIBER PO Take 1 tablet by mouth daily.     Flaxseed, Linseed, (FLAX SEED OIL PO) Take 3 tablets by mouth daily.      Multiple Vitamin (MULTIVITAMIN) tablet Take 1 tablet by mouth daily.      omeprazole (PRILOSEC) 20 MG capsule TAKE 1 CAPSULE BY MOUTH DAILY 90 capsule 3   OVER THE COUNTER MEDICATION Iron 65 mg. One two tablets daily.     VITAMIN D PO Take 3 capsules by mouth daily.     predniSONE (DELTASONE) 50 MG tablet Take 1 tablet (50 mg total) by mouth daily with breakfast. (Patient not taking: Reported on 10/29/2020) 5 tablet 0   No facility-administered medications prior to visit.    No Known Allergies  Review of Systems  Constitutional:  Negative for fever.  HENT:  Negative for congestion and sore throat.   Respiratory:  Negative for cough, shortness of breath and wheezing.   Cardiovascular:  Negative for chest pain.  Gastrointestinal:  Negative for blood in stool, constipation, diarrhea, nausea and vomiting.  Genitourinary:  Negative for dysuria, frequency and hematuria.  Musculoskeletal:  Negative for joint pain and myalgias.  Skin:        (-)New moles (+)mass on lower neck  Neurological:  Negative for headaches.      Objective:    Physical Exam Constitutional:      General: She is not in acute distress.    Appearance: Normal appearance. She is not ill-appearing.  HENT:     Head: Normocephalic and atraumatic.     Right Ear: Tympanic membrane, ear canal and external ear normal.     Left Ear: Tympanic membrane, ear canal and external ear normal.  Eyes:     Extraocular Movements: Extraocular movements intact.     Pupils: Pupils are equal, round, and reactive to light.  Cardiovascular:     Rate and Rhythm: Normal rate and regular rhythm.     Heart sounds: Normal heart sounds. No murmur heard.   No gallop.  Pulmonary:     Effort:  Pulmonary effort is normal. No respiratory distress.     Breath sounds: Normal breath sounds. No wheezing or rales.  Abdominal:     General: Bowel sounds are normal. There is no distension.     Palpations: Abdomen is soft.     Tenderness: There is no abdominal tenderness. There is no guarding.  Skin:    General: Skin is warm and dry.     Comments: sebaceous cyst on nape of neck  Neurological:     Mental  Status: She is alert and oriented to person, place, and time.  Psychiatric:        Behavior: Behavior normal.        Judgment: Judgment normal.    BP 134/88 (BP Location: Right Arm, Patient Position: Sitting, Cuff Size: Normal)   Pulse 66   Temp 98.1 F (36.7 C) (Oral)   Resp 18   Ht 5\' 7"  (1.702 m)   Wt 200 lb 12.8 oz (91.1 kg)   LMP 09/28/2013   SpO2 98%   BMI 31.45 kg/m  Wt Readings from Last 3 Encounters:  10/29/20 200 lb 12.8 oz (91.1 kg)  06/01/19 200 lb (90.7 kg)  03/27/19 200 lb 6.4 oz (90.9 kg)    Diabetic Foot Exam - Simple   No data filed    Lab Results  Component Value Date   WBC 4.8 10/29/2020   HGB 13.1 10/29/2020   HCT 39.3 10/29/2020   PLT 239.0 10/29/2020   GLUCOSE 76 10/29/2020   CHOL 250 (H) 10/29/2020   TRIG 140.0 10/29/2020   HDL 51.80 10/29/2020   LDLDIRECT 156.0 05/13/2012   LDLCALC 170 (H) 10/29/2020   ALT 19 10/29/2020   AST 20 10/29/2020   NA 140 10/29/2020   K 4.2 10/29/2020   CL 104 10/29/2020   CREATININE 1.06 10/29/2020   BUN 15 10/29/2020   CO2 27 10/29/2020   TSH 1.77 10/29/2020   INR 1.02 12/24/2009    Lab Results  Component Value Date   TSH 1.77 10/29/2020   Lab Results  Component Value Date   WBC 4.8 10/29/2020   HGB 13.1 10/29/2020   HCT 39.3 10/29/2020   MCV 89.8 10/29/2020   PLT 239.0 10/29/2020   Lab Results  Component Value Date   NA 140 10/29/2020   K 4.2 10/29/2020   CO2 27 10/29/2020   GLUCOSE 76 10/29/2020   BUN 15 10/29/2020   CREATININE 1.06 10/29/2020   BILITOT 0.6 10/29/2020   ALKPHOS 78  10/29/2020   AST 20 10/29/2020   ALT 19 10/29/2020   PROT 7.3 10/29/2020   ALBUMIN 4.6 10/29/2020   CALCIUM 10.7 (H) 10/29/2020   ANIONGAP 5 02/06/2014   GFR 56.21 (L) 10/29/2020   Lab Results  Component Value Date   CHOL 250 (H) 10/29/2020   Lab Results  Component Value Date   HDL 51.80 10/29/2020   Lab Results  Component Value Date   LDLCALC 170 (H) 10/29/2020   Lab Results  Component Value Date   TRIG 140.0 10/29/2020   Lab Results  Component Value Date   CHOLHDL 5 10/29/2020   No results found for: HGBA1C  Mammogram- Last completed 01/09/2020. Results are normal. Repeat in 1 year. Due.  Colonoscopy- Last completed 12/08/2018. Results show one diminutive polyp in the cecum which was removed, otherwise results are normal.      Assessment & Plan:   Problem List Items Addressed This Visit       Unprioritized   Preventative health care - Primary    ghm utd Check labs  See AVS      Relevant Orders   CBC with Differential/Platelet (Completed)   Comprehensive metabolic panel (Completed)   TSH (Completed)   Lipid panel (Completed)   Other Visit Diagnoses     Need for influenza vaccination       Relevant Orders   Flu Vaccine QUAD 80mo+IM (Fluarix, Fluzone & Alfiuria Quad PF) (Completed)   Estrogen deficiency       Relevant Orders  DG Bone Density   Vitamin D deficiency       Relevant Orders   Vitamin D (25 hydroxy) (Completed)        No orders of the defined types were placed in this encounter.   I, Dr. Roma Schanz, DO, personally preformed the services described in this documentation.  All medical record entries made by the scribe were at my direction and in my presence.  I have reviewed the chart and discharge instructions (if applicable) and agree that the record reflects my personal performance and is accurate and complete. 10/29/2020   I,Shehryar Baig,acting as a scribe for [name], DO.,have documented all relevant  documentation on the behalf of [name], DO,as directed by  [name], DO while in the presence of [name], DO.   [name], DO

## 2020-10-30 LAB — COMPREHENSIVE METABOLIC PANEL
ALT: 19 U/L (ref 0–35)
AST: 20 U/L (ref 0–37)
Albumin: 4.6 g/dL (ref 3.5–5.2)
Alkaline Phosphatase: 78 U/L (ref 39–117)
BUN: 15 mg/dL (ref 6–23)
CO2: 27 mEq/L (ref 19–32)
Calcium: 10.7 mg/dL — ABNORMAL HIGH (ref 8.4–10.5)
Chloride: 104 mEq/L (ref 96–112)
Creatinine, Ser: 1.06 mg/dL (ref 0.40–1.20)
GFR: 56.21 mL/min — ABNORMAL LOW (ref >60.00)
Glucose, Bld: 76 mg/dL (ref 70–99)
Potassium: 4.2 mEq/L (ref 3.5–5.1)
Sodium: 140 mEq/L (ref 135–145)
Total Bilirubin: 0.6 mg/dL (ref 0.2–1.2)
Total Protein: 7.3 g/dL (ref 6.0–8.3)

## 2020-10-30 LAB — LIPID PANEL
Cholesterol: 250 mg/dL — ABNORMAL HIGH (ref 0–200)
HDL: 51.8 mg/dL (ref >39.00)
LDL Cholesterol: 170 mg/dL — ABNORMAL HIGH (ref 0–99)
NonHDL: 197.78
Total CHOL/HDL Ratio: 5
Triglycerides: 140 mg/dL (ref 0.0–149.0)
VLDL: 28 mg/dL (ref 0.0–40.0)

## 2020-10-30 LAB — CBC WITH DIFFERENTIAL/PLATELET
Basophils Absolute: 0 10*3/uL (ref 0.0–0.1)
Basophils Relative: 0.8 % (ref 0.0–3.0)
Eosinophils Absolute: 0.1 10*3/uL (ref 0.0–0.7)
Eosinophils Relative: 1.6 % (ref 0.0–5.0)
HCT: 39.3 % (ref 36.0–46.0)
Hemoglobin: 13.1 g/dL (ref 12.0–15.0)
Lymphocytes Relative: 53 % — ABNORMAL HIGH (ref 12.0–46.0)
Lymphs Abs: 2.5 10*3/uL (ref 0.7–4.0)
MCHC: 33.3 g/dL (ref 30.0–36.0)
MCV: 89.8 fl (ref 78.0–100.0)
Monocytes Absolute: 0.3 10*3/uL (ref 0.1–1.0)
Monocytes Relative: 6 % (ref 3.0–12.0)
Neutro Abs: 1.8 10*3/uL (ref 1.4–7.7)
Neutrophils Relative %: 38.6 % — ABNORMAL LOW (ref 43.0–77.0)
Platelets: 239 10*3/uL (ref 150.0–400.0)
RBC: 4.37 Mil/uL (ref 3.87–5.11)
RDW: 14.1 % (ref 11.5–15.5)
WBC: 4.8 10*3/uL (ref 4.0–10.5)

## 2020-10-30 LAB — TSH: TSH: 1.77 u[IU]/mL (ref 0.35–5.50)

## 2020-10-30 LAB — VITAMIN D 25 HYDROXY (VIT D DEFICIENCY, FRACTURES): VITD: 97.44 ng/mL (ref 30.00–100.00)

## 2020-10-31 ENCOUNTER — Other Ambulatory Visit: Payer: Self-pay | Admitting: Family Medicine

## 2020-10-31 DIAGNOSIS — E785 Hyperlipidemia, unspecified: Secondary | ICD-10-CM

## 2020-11-07 NOTE — Assessment & Plan Note (Signed)
ghm utd Check labs See AVS 

## 2020-11-18 ENCOUNTER — Other Ambulatory Visit: Payer: Self-pay | Admitting: Family Medicine

## 2020-11-18 DIAGNOSIS — Z1231 Encounter for screening mammogram for malignant neoplasm of breast: Secondary | ICD-10-CM

## 2020-12-06 ENCOUNTER — Ambulatory Visit: Payer: PRIVATE HEALTH INSURANCE | Admitting: Family Medicine

## 2020-12-13 ENCOUNTER — Encounter: Payer: Self-pay | Admitting: Family Medicine

## 2020-12-13 ENCOUNTER — Ambulatory Visit (INDEPENDENT_AMBULATORY_CARE_PROVIDER_SITE_OTHER): Payer: PRIVATE HEALTH INSURANCE | Admitting: Family Medicine

## 2020-12-13 ENCOUNTER — Other Ambulatory Visit: Payer: Self-pay

## 2020-12-13 VITALS — BP 128/88 | HR 65 | Temp 97.6°F | Resp 18 | Ht 67.0 in | Wt 200.6 lb

## 2020-12-13 DIAGNOSIS — L723 Sebaceous cyst: Secondary | ICD-10-CM | POA: Diagnosis not present

## 2020-12-13 NOTE — Patient Instructions (Addendum)
Epidermoid Cyst An epidermoid cyst, also known as epidermal cyst, is a sac made of skin tissue. The sac contains a substance called keratin. Keratin is a protein that is normally secreted through the hair follicles. When keratin becomes trapped in the top layer of skin (epidermis), it can form an epidermoid cyst. Epidermoid cysts can be found anywhere on your body. These cysts are usually harmless (benign), and they may not cause symptoms unless they become inflamed or infected. What are the causes? This condition may be caused by: A blocked hair follicle. A hair that curls and re-enters the skin instead of growing straight out of the skin (ingrown hair). A blocked pore. Irritated skin. An injury to the skin. Certain conditions that are passed along from parent to child (inherited). Human papillomavirus (HPV). This happens rarely when cysts occur on the bottom of the feet. Long-term (chronic) sun damage to the skin. What increases the risk? The following factors may make you more likely to develop an epidermoid cyst: Having acne. Being female. Having an injury to the skin. Being past puberty. Having certain rare genetic disorders. What are the signs or symptoms? The only symptom of this condition may be a small, painless lump underneath the skin. When an epidermal cyst ruptures, it may become inflamed. True infection in cysts is rare. Symptoms may include: Redness. Inflammation. Tenderness. Warmth. Keratin draining from the cyst. Keratin is grayish-white, bad-smelling substance. Pus draining from the cyst. How is this diagnosed? This condition is diagnosed with a physical exam. In some cases, you may have a sample of tissue (biopsy) taken from your cyst to be examined under a microscope or tested for bacteria. You may be referred to a health care provider who specializes in skin care (dermatologist). How is this treated? If a cyst becomes inflamed, treatment may include: Opening  and draining the cyst, done by a health care provider. After draining, minor surgery to remove the rest of the cyst may be done. Taking antibiotic medicine. Having injections of medicines (steroids) that help to reduce inflammation. Having surgery to remove the cyst. Surgery may be done if the cyst: Becomes large. Bothers you. Has a chance of turning into cancer. Do not try to open a cyst yourself. Follow these instructions at home: Medicines If you were prescribed an antibiotic medicine, take it it as told by your health care provider. Do not stop using the antibiotic even if you start to feel better. Take over-the-counter and prescription medicines only as told by your health care provider. General instructions Keep the area around your cyst clean and dry. Wear loose, dry clothing. Avoid touching your cyst. Check your cyst every day for signs of infection. Check for: Redness, swelling, or pain. Fluid or blood. Warmth. Pus or a bad smell. Keep all follow-up visits. This is important. How is this prevented? Wear clean, dry, clothing. Avoid wearing tight clothing. Keep your skin clean and dry. Take showers or baths every day. Contact a health care provider if: Your cyst develops symptoms of infection. Your condition is not improving or is getting worse. You develop a cyst that looks different from other cysts you have had. You have a fever. Get help right away if: Redness spreads from the cyst into the surrounding area. Summary An epidermoid cyst is a sac made of skin tissue. These cysts are usually harmless (benign), and they may not cause symptoms unless they become inflamed. If a cyst becomes inflamed, treatment may include surgery to open and drain  the cyst, or to remove it. Treatment may also include medicines by mouth or through an injection. Take over-the-counter and prescription medicines only as told by your health care provider. If you were prescribed an antibiotic  medicine, take it as told by your health care provider. Do not stop using the antibiotic even if you start to feel better. Contact a health care provider if your condition is not improving or is getting worse. Keep all follow-up visits as told by your health care provider. This is important. This information is not intended to replace advice given to you by your health care provider. Make sure you discuss any questions you have with your health care provider. Document Revised: 04/19/2019 Document Reviewed: 04/19/2019 Elsevier Patient Education  [employer].

## 2020-12-13 NOTE — Assessment & Plan Note (Signed)
Pt tolerated procedure well  Keep dry 24 h then may shower rto 5 days for suture removal

## 2020-12-13 NOTE — Progress Notes (Signed)
Subjective:   By signing my name below, I, Gwendolyn Weaver, attest that this documentation has been prepared under the direction and in the presence of [name], DO. 12/13/2020    Patient ID: Gwendolyn Weaver, female    DOB: [birth year], 60 y.o.   MRN: [id number]  Chief Complaint  Patient presents with  . Cyst    HPI Patient is in today for an office visit.  She has a cyst on her lower neck that has ben there for over a year. She would like to get it removed at this visit. She describes it as non-tender but tender when palpated and is aggravating. Also mentions there is exudate sometimes and the cyst is noticeably growing bigger.  The cyst will be removed today.    Past Medical History:  Diagnosis Date  . Acid reflux disease   . Allergy   . Complication of anesthesia    inability to void  . Heart murmur   . 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 Sickle cell trait [facility])     Past Surgical History:  Procedure Laterality Date  . ABDOMINAL HYSTERECTOMY     fibroids  . CESAREAN SECTION     X2  BTL  . CYSTOSCOPY N/A 02/13/2014   Procedure: CYSTOSCOPY;  Surgeon: Anastasio Auerbach, MD;  Location: [place] [facility];  Service: Gynecology;  Laterality: N/A;  . DILATATION & CURETTAGE/HYSTEROSCOPY WITH TRUECLEAR N/A 10/19/2013   Procedure: DILATATION & CURETTAGE/HYSTEROSCOPY ;  Surgeon: Anastasio Auerbach, MD;  Location: [place] [facility];  Service: Gynecology;  Laterality: N/A;  . DILATION AND CURETTAGE OF UTERUS    . HEMORRHOID SURGERY    . HEMORRHOID SURGERY  80'S  . LAPAROSCOPIC ASSISTED VAGINAL HYSTERECTOMY N/A 02/13/2014   Procedure: LAPAROSCOPIC ASSISTED VAGINAL HYSTERECTOMY;  Surgeon: Anastasio Auerbach, MD;  Location: [place] [facility];  Service: Gynecology;  Laterality: N/A;  . LAPAROSCOPIC BILATERAL SALPINGO OOPHERECTOMY Bilateral 02/13/2014   Procedure: LAPAROSCOPIC BILATERAL SALPINGO OOPHORECTOMY;  Surgeon: Anastasio Auerbach, MD;  Location: [place] [facility];   Service: Gynecology;  Laterality: Bilateral;  . PARATHYROID EXPLORATION  12/2009   SMALL TUMOR REMOVED. BENIGN.    Family History  Problem Relation Age of Onset  . Hypertension Father   . Other Father        circulation issues - bilateral amputations  . Prostate cancer Father 13  . Asthma Mother   . Colon cancer Neg Hx   . Esophageal cancer Neg Hx   . Rectal cancer Neg Hx   . Stomach cancer Neg Hx     Social History   Socioeconomic History  . Marital status: Married    Spouse name: Not on file  . Number of children: Not on file  . Years of education: Not on file  . Highest education level: Not on file  Occupational History  . Occupation: Sports coach  Tobacco Use  . Smoking status: Never  . Smokeless tobacco: Never  Substance and Sexual Activity  . Alcohol use: No  . Drug use: No  . Sexual activity: Yes    Birth control/protection: None  Other Topics Concern  . Not on file  Social History Narrative   Exercise-2 days week   Social Determinants of Health   Financial Resource Strain: Not on file  Food Insecurity: Not on file  Transportation Needs: Not on file  Physical Activity: Not on file  Stress: Not on file  Social Connections: Not on file  Intimate Partner  Violence: Not on file    Outpatient Medications Prior to Visit  Medication Sig Dispense Refill  . acetaminophen (TYLENOL) 500 MG tablet Take 1,000 mg by mouth every 6 (six) hours as needed (headache).    Marland Kitchen BIOTIN PO Take 1 capsule by mouth daily.    Marland Kitchen FIBER PO Take 1 tablet by mouth daily.    . Flaxseed, Linseed, (FLAX SEED OIL PO) Take 3 tablets by mouth daily.     . Multiple Vitamin (MULTIVITAMIN) tablet Take 1 tablet by mouth daily.     Marland Kitchen omeprazole (PRILOSEC) 20 MG capsule TAKE 1 CAPSULE BY MOUTH DAILY 90 capsule 3  . OVER THE COUNTER MEDICATION Iron 65 mg. One two tablets daily.    Marland Kitchen VITAMIN D PO Take 3 capsules by mouth daily.     No facility-administered medications prior to visit.    No Known  Allergies  Review of Systems  Constitutional:  Negative for fever.  HENT:  Negative for congestion, ear pain, hearing loss, sinus pain and sore throat.   Eyes:  Negative for blurred vision and pain.  Respiratory:  Negative for cough, sputum production, shortness of breath and wheezing.   Cardiovascular:  Negative for chest pain and palpitations.  Gastrointestinal:  Negative for blood in stool, constipation, diarrhea, nausea and vomiting.  Genitourinary:  Negative for dysuria, frequency, hematuria and urgency.  Musculoskeletal:  Negative for back pain, falls and myalgias.  Neurological:  Negative for dizziness, sensory change, loss of consciousness, weakness and headaches.  Endo/Heme/Allergies:  Negative for environmental allergies. Does not bruise/bleed easily.  Psychiatric/Behavioral:  Negative for depression and suicidal ideas. The patient is not nervous/anxious and does not have insomnia.       Objective:    Physical Exam Vitals and nursing note reviewed.  Constitutional:      General: She is not in acute distress.    Appearance: Normal appearance. She is not ill-appearing.  HENT:     Head: Normocephalic and atraumatic.  Neck:      Comments: Procedure----  written consent obtained + 1 cm seb cyst R side back of neck Area cleaned with betadine and 1 % lidocaine used to anesthetize the area  Scalpel used to make sm incision and sebum expressed  Most of the capsule removed and 4.0 ethilon used to place 1 simple suture  Area cleaned and bandage applied  Musculoskeletal:     Cervical back: Normal range of motion and neck supple.  Lymphadenopathy:     Cervical: No cervical adenopathy.  Skin:    General: Skin is warm and dry.  Neurological:     Mental Status: She is alert and oriented to person, place, and time.  Psychiatric:        Behavior: Behavior normal.    BP 128/88 (BP Location: Left Arm, Patient Position: Sitting, Cuff Size: Normal)   Pulse 65   Temp 97.6 F (36.4  C) (Oral)   Resp 18   Ht 5\' 7"  (1.702 m)   Wt 200 lb 9.6 oz (91 kg)   LMP 09/28/2013   SpO2 99%   BMI 31.42 kg/m  Wt Readings from Last 3 Encounters:  12/13/20 200 lb 9.6 oz (91 kg)  10/29/20 200 lb 12.8 oz (91.1 kg)  06/01/19 200 lb (90.7 kg)    Diabetic Foot Exam - Simple   No data filed    Lab Results  Component Value Date   WBC 4.8 10/29/2020   HGB 13.1 10/29/2020   HCT 39.3 10/29/2020  PLT 239.0 10/29/2020   GLUCOSE 76 10/29/2020   CHOL 250 (H) 10/29/2020   TRIG 140.0 10/29/2020   HDL 51.80 10/29/2020   LDLDIRECT 156.0 05/13/2012   LDLCALC 170 (H) 10/29/2020   ALT 19 10/29/2020   AST 20 10/29/2020   NA 140 10/29/2020   K 4.2 10/29/2020   CL 104 10/29/2020   CREATININE 1.06 10/29/2020   BUN 15 10/29/2020   CO2 27 10/29/2020   TSH 1.77 10/29/2020   INR 1.02 12/24/2009    Lab Results  Component Value Date   TSH 1.77 10/29/2020   Lab Results  Component Value Date   WBC 4.8 10/29/2020   HGB 13.1 10/29/2020   HCT 39.3 10/29/2020   MCV 89.8 10/29/2020   PLT 239.0 10/29/2020   Lab Results  Component Value Date   NA 140 10/29/2020   K 4.2 10/29/2020   CO2 27 10/29/2020   GLUCOSE 76 10/29/2020   BUN 15 10/29/2020   CREATININE 1.06 10/29/2020   BILITOT 0.6 10/29/2020   ALKPHOS 78 10/29/2020   AST 20 10/29/2020   ALT 19 10/29/2020   PROT 7.3 10/29/2020   ALBUMIN 4.6 10/29/2020   CALCIUM 10.7 (H) 10/29/2020   ANIONGAP 5 02/06/2014   GFR 56.21 (L) 10/29/2020   Lab Results  Component Value Date   CHOL 250 (H) 10/29/2020   Lab Results  Component Value Date   HDL 51.80 10/29/2020   Lab Results  Component Value Date   LDLCALC 170 (H) 10/29/2020   Lab Results  Component Value Date   TRIG 140.0 10/29/2020   Lab Results  Component Value Date   CHOLHDL 5 10/29/2020   No results found for: HGBA1C     Assessment & Plan:   Problem List Items Addressed This Visit   None   No orders of the defined types were placed in this  encounter.   I,Gwendolyn Weaver,acting as a Education administrator for [employer], DO.,have documented all relevant documentation on the behalf of [name], DO,as directed by  [name], DO while in the presence of [name], [place], DO., personally preformed the services described in this documentation.  All medical record entries made by the scribe were at my direction and in my presence.  I have reviewed the chart and discharge instructions (if applicable) and agree that the record reflects my personal performance and is accurate and complete. 12/13/2020

## 2020-12-18 ENCOUNTER — Other Ambulatory Visit: Payer: Self-pay

## 2020-12-18 ENCOUNTER — Ambulatory Visit: Payer: PRIVATE HEALTH INSURANCE

## 2020-12-18 DIAGNOSIS — L723 Sebaceous cyst: Secondary | ICD-10-CM

## 2020-12-18 NOTE — Progress Notes (Signed)
Pt here to have one stitch removed from the back of her neck. Pt reports wound still being sore. Wound did not appear red or irritated but wound was not flat to skin and looked like it could have refilled. I removed one  stitch with minimal bleeding and placed a Band-Aid. I advised patient to return for any concerns.

## 2021-01-09 ENCOUNTER — Ambulatory Visit
Admission: RE | Admit: 2021-01-09 | Discharge: 2021-01-09 | Disposition: A | Discharge status: Home / Self Care | Payer: PRIVATE HEALTH INSURANCE | Source: Ambulatory Visit | Attending: Family Medicine | Admitting: Family Medicine

## 2021-01-09 DIAGNOSIS — Z1231 Encounter for screening mammogram for malignant neoplasm of breast: Secondary | ICD-10-CM

## 2021-02-11 ENCOUNTER — Ambulatory Visit
Admission: RE | Admit: 2021-02-11 | Discharge: 2021-02-11 | Disposition: A | Discharge status: Home / Self Care | Payer: PRIVATE HEALTH INSURANCE | Source: Ambulatory Visit | Attending: Family Medicine | Admitting: Family Medicine

## 2021-02-11 ENCOUNTER — Other Ambulatory Visit: Payer: Self-pay

## 2021-02-11 DIAGNOSIS — E2839 Other primary ovarian failure: Secondary | ICD-10-CM

## 2021-03-11 ENCOUNTER — Other Ambulatory Visit: Payer: PRIVATE HEALTH INSURANCE

## 2021-06-16 ENCOUNTER — Other Ambulatory Visit: Payer: Self-pay | Admitting: Family Medicine

## 2021-06-16 DIAGNOSIS — K219 Gastro-esophageal reflux disease without esophagitis: Secondary | ICD-10-CM

## 2021-09-11 ENCOUNTER — Telehealth: Payer: Self-pay | Admitting: Family Medicine

## 2021-09-11 DIAGNOSIS — K219 Gastro-esophageal reflux disease without esophagitis: Secondary | ICD-10-CM

## 2021-09-11 MED ORDER — OMEPRAZOLE 20 MG PO CPDR: 20.0000 mg | DELAYED_RELEASE_CAPSULE | Freq: Every day | ORAL | 0 refills | Status: DC

## 2021-09-11 NOTE — Telephone Encounter (Signed)
Pt is out of medication, and ins is hoping we can send ina 14 day supply to a local pharmacy.   Medication: omeprazole (PRILOSEC) 20 MG capsule   Has the patient contacted their pharmacy? No.  Preferred Pharmacy:  [id number] [place] [address] Phone:  [phone] Fax:  [phone]

## 2021-09-11 NOTE — Telephone Encounter (Signed)
Refill sent.

## 2021-10-03 ENCOUNTER — Other Ambulatory Visit: Payer: Self-pay | Admitting: Family Medicine

## 2021-10-03 DIAGNOSIS — Z1231 Encounter for screening mammogram for malignant neoplasm of breast: Secondary | ICD-10-CM

## 2021-10-04 ENCOUNTER — Other Ambulatory Visit: Payer: Self-pay | Admitting: Family Medicine

## 2021-10-04 DIAGNOSIS — K219 Gastro-esophageal reflux disease without esophagitis: Secondary | ICD-10-CM

## 2021-10-31 ENCOUNTER — Encounter: Payer: PRIVATE HEALTH INSURANCE | Admitting: Family Medicine

## 2021-11-27 ENCOUNTER — Encounter: Payer: PRIVATE HEALTH INSURANCE | Admitting: Family Medicine

## 2021-12-30 ENCOUNTER — Ambulatory Visit: Payer: Managed Care, Other (non HMO) | Admitting: Family Medicine

## 2021-12-30 ENCOUNTER — Encounter: Payer: Self-pay | Admitting: Family Medicine

## 2021-12-30 VITALS — BP 108/80 | HR 63 | Temp 97.7°F | Resp 18 | Ht 67.0 in | Wt 206.0 lb

## 2021-12-30 DIAGNOSIS — Z0000 Encounter for general adult medical examination without abnormal findings: Secondary | ICD-10-CM

## 2021-12-30 DIAGNOSIS — K219 Gastro-esophageal reflux disease without esophagitis: Secondary | ICD-10-CM | POA: Diagnosis not present

## 2021-12-30 DIAGNOSIS — E785 Hyperlipidemia, unspecified: Secondary | ICD-10-CM

## 2021-12-30 MED ORDER — OMEPRAZOLE 20 MG PO CPDR: 20.0000 mg | DELAYED_RELEASE_CAPSULE | Freq: Every day | ORAL | 3 refills | Status: DC

## 2021-12-30 NOTE — Assessment & Plan Note (Signed)
.  sblipid

## 2021-12-30 NOTE — Assessment & Plan Note (Signed)
Ghm utd Check labs  See AVS  
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
## 2021-12-31 LAB — CBC WITH DIFFERENTIAL/PLATELET
Basophils Absolute: 0 10*3/uL (ref 0.0–0.1)
Basophils Relative: 0.9 % (ref 0.0–3.0)
Eosinophils Absolute: 0.1 10*3/uL (ref 0.0–0.7)
Eosinophils Relative: 2.1 % (ref 0.0–5.0)
HCT: 39.5 % (ref 36.0–46.0)
Hemoglobin: 13.2 g/dL (ref 12.0–15.0)
Lymphocytes Relative: 57.6 % — ABNORMAL HIGH (ref 12.0–46.0)
Lymphs Abs: 2.8 10*3/uL (ref 0.7–4.0)
MCHC: 33.5 g/dL (ref 30.0–36.0)
MCV: 90.6 fl (ref 78.0–100.0)
Monocytes Absolute: 0.4 10*3/uL (ref 0.1–1.0)
Monocytes Relative: 8.4 % (ref 3.0–12.0)
Neutro Abs: 1.5 10*3/uL (ref 1.4–7.7)
Neutrophils Relative %: 31 % — ABNORMAL LOW (ref 43.0–77.0)
Platelets: 251 10*3/uL (ref 150.0–400.0)
RBC: 4.36 Mil/uL (ref 3.87–5.11)
RDW: 13.9 % (ref 11.5–15.5)
WBC: 4.9 10*3/uL (ref 4.0–10.5)

## 2021-12-31 LAB — LIPID PANEL
Cholesterol: 261 mg/dL — ABNORMAL HIGH (ref 0–200)
HDL: 45.6 mg/dL (ref >39.00)
NonHDL: 215.74
Total CHOL/HDL Ratio: 6
Triglycerides: 261 mg/dL — ABNORMAL HIGH (ref 0.0–149.0)
VLDL: 52.2 mg/dL — ABNORMAL HIGH (ref 0.0–40.0)

## 2021-12-31 LAB — COMPREHENSIVE METABOLIC PANEL
ALT: 18 U/L (ref 0–35)
AST: 17 U/L (ref 0–37)
Albumin: 4.6 g/dL (ref 3.5–5.2)
Alkaline Phosphatase: 84 U/L (ref 39–117)
BUN: 14 mg/dL (ref 6–23)
CO2: 28 mEq/L (ref 19–32)
Calcium: 10.4 mg/dL (ref 8.4–10.5)
Chloride: 104 mEq/L (ref 96–112)
Creatinine, Ser: 1.06 mg/dL (ref 0.40–1.20)
GFR: 55.75 mL/min — ABNORMAL LOW (ref >60.00)
Glucose, Bld: 77 mg/dL (ref 70–99)
Potassium: 4.5 mEq/L (ref 3.5–5.1)
Sodium: 140 mEq/L (ref 135–145)
Total Bilirubin: 0.5 mg/dL (ref 0.2–1.2)
Total Protein: 7.1 g/dL (ref 6.0–8.3)

## 2021-12-31 LAB — TSH: TSH: 2.19 u[IU]/mL (ref 0.35–5.50)

## 2021-12-31 LAB — LDL CHOLESTEROL, DIRECT: Direct LDL: 167 mg/dL

## 2021-12-31 NOTE — Assessment & Plan Note (Signed)
Omeprazole  Check labs ---  consider GI

## 2022-01-12 ENCOUNTER — Ambulatory Visit
Admission: RE | Admit: 2022-01-12 | Discharge: 2022-01-12 | Disposition: A | Discharge status: Home / Self Care | Payer: Managed Care, Other (non HMO) | Source: Ambulatory Visit | Attending: Family Medicine | Admitting: Family Medicine

## 2022-01-12 DIAGNOSIS — Z1231 Encounter for screening mammogram for malignant neoplasm of breast: Secondary | ICD-10-CM

## 2022-06-12 ENCOUNTER — Other Ambulatory Visit: Payer: Self-pay | Admitting: Family Medicine

## 2022-06-12 DIAGNOSIS — K219 Gastro-esophageal reflux disease without esophagitis: Secondary | ICD-10-CM

## 2022-10-29 ENCOUNTER — Other Ambulatory Visit: Payer: Self-pay | Admitting: Family Medicine

## 2022-10-29 DIAGNOSIS — Z1231 Encounter for screening mammogram for malignant neoplasm of breast: Secondary | ICD-10-CM

## 2023-01-04 ENCOUNTER — Ambulatory Visit: Payer: Managed Care, Other (non HMO) | Admitting: Family Medicine

## 2023-01-04 ENCOUNTER — Encounter: Payer: Self-pay | Admitting: Family Medicine

## 2023-01-04 VITALS — BP 120/84 | HR 63 | Temp 97.8°F | Resp 18 | Ht 67.0 in | Wt 201.8 lb

## 2023-01-04 DIAGNOSIS — Z0000 Encounter for general adult medical examination without abnormal findings: Secondary | ICD-10-CM | POA: Diagnosis not present

## 2023-01-04 DIAGNOSIS — E2839 Other primary ovarian failure: Secondary | ICD-10-CM | POA: Diagnosis not present

## 2023-01-04 DIAGNOSIS — E785 Hyperlipidemia, unspecified: Secondary | ICD-10-CM

## 2023-01-04 DIAGNOSIS — K219 Gastro-esophageal reflux disease without esophagitis: Secondary | ICD-10-CM | POA: Diagnosis not present

## 2023-01-04 LAB — COMPREHENSIVE METABOLIC PANEL
ALT: 18 U/L (ref 0–35)
AST: 20 U/L (ref 0–37)
Albumin: 4.5 g/dL (ref 3.5–5.2)
Alkaline Phosphatase: 87 U/L (ref 39–117)
BUN: 16 mg/dL (ref 6–23)
CO2: 24 meq/L (ref 19–32)
Calcium: 10.1 mg/dL (ref 8.4–10.5)
Chloride: 106 meq/L (ref 96–112)
Creatinine, Ser: 1.01 mg/dL (ref 0.40–1.20)
GFR: 58.66 mL/min — ABNORMAL LOW (ref >60.00)
Glucose, Bld: 81 mg/dL (ref 70–99)
Potassium: 4.2 meq/L (ref 3.5–5.1)
Sodium: 140 meq/L (ref 135–145)
Total Bilirubin: 0.6 mg/dL (ref 0.2–1.2)
Total Protein: 7 g/dL (ref 6.0–8.3)

## 2023-01-04 LAB — LIPID PANEL
Cholesterol: 265 mg/dL — ABNORMAL HIGH (ref 0–200)
HDL: 46 mg/dL (ref >39.00)
LDL Cholesterol: 199 mg/dL — ABNORMAL HIGH (ref 0–99)
NonHDL: 219.13
Total CHOL/HDL Ratio: 6
Triglycerides: 99 mg/dL (ref 0.0–149.0)
VLDL: 19.8 mg/dL (ref 0.0–40.0)

## 2023-01-04 LAB — CBC WITH DIFFERENTIAL/PLATELET
Basophils Absolute: 0 10*3/uL (ref 0.0–0.1)
Basophils Relative: 0.4 % (ref 0.0–3.0)
Eosinophils Absolute: 0.1 10*3/uL (ref 0.0–0.7)
Eosinophils Relative: 1.3 % (ref 0.0–5.0)
HCT: 39.4 % (ref 36.0–46.0)
Hemoglobin: 13.2 g/dL (ref 12.0–15.0)
Lymphocytes Relative: 56.8 % — ABNORMAL HIGH (ref 12.0–46.0)
Lymphs Abs: 2.3 10*3/uL (ref 0.7–4.0)
MCHC: 33.4 g/dL (ref 30.0–36.0)
MCV: 90.7 fL (ref 78.0–100.0)
Monocytes Absolute: 0.3 10*3/uL (ref 0.1–1.0)
Monocytes Relative: 7.3 % (ref 3.0–12.0)
Neutro Abs: 1.4 10*3/uL (ref 1.4–7.7)
Neutrophils Relative %: 34.2 % — ABNORMAL LOW (ref 43.0–77.0)
Platelets: 237 10*3/uL (ref 150.0–400.0)
RBC: 4.35 Mil/uL (ref 3.87–5.11)
RDW: 14 % (ref 11.5–15.5)
WBC: 4.1 10*3/uL (ref 4.0–10.5)

## 2023-01-04 LAB — TSH: TSH: 1.16 u[IU]/mL (ref 0.35–5.50)

## 2023-01-04 MED ORDER — OMEPRAZOLE 20 MG PO CPDR: 20.0000 mg | DELAYED_RELEASE_CAPSULE | Freq: Every day | ORAL | 3 refills | Status: DC

## 2023-01-04 NOTE — Progress Notes (Signed)
   Established Patient Office Visit  Subjective   Patient ID: Gwendolyn Weaver, female    DOB: 1960-07-19  Age: 62 y.o. MRN: [id number]  Chief Complaint  Patient presents with   Annual Exam    Pt states fasting     HPI    ROS    Objective:     BP 120/84 (BP Location: Left Arm, Patient Position: Sitting, Cuff Size: Large)   Pulse 63   Temp 97.8 F (36.6 C) (Oral)   Resp 18   Ht 5\' 7"  (1.702 m)   Wt 201 lb 12.8 oz (91.5 kg)   LMP 09/28/2013   SpO2 97%   BMI 31.61 kg/m    Physical Exam   No results found for any visits on 01/04/23.    The 10-year ASCVD risk score (Arnett DK, et al., 2019) is: 8.4%    Assessment & Plan:   Problem List Items Addressed This Visit       Unprioritized   Preventative health care - Primary   Hyperlipidemia with target low density lipoprotein (LDL) cholesterol less than 100 mg/dL   Gastroesophageal reflux disease   Relevant Medications   omeprazole (PRILOSEC) 20 MG capsule    No follow-ups on file.    Donato Schultz, DO

## 2023-01-15 ENCOUNTER — Ambulatory Visit: Payer: Managed Care, Other (non HMO)

## 2023-01-18 ENCOUNTER — Ambulatory Visit
Admission: RE | Admit: 2023-01-18 | Discharge: 2023-01-18 | Disposition: A | Discharge status: Home / Self Care | Payer: Managed Care, Other (non HMO) | Source: Ambulatory Visit | Attending: Family Medicine | Admitting: Family Medicine

## 2023-01-18 DIAGNOSIS — Z1231 Encounter for screening mammogram for malignant neoplasm of breast: Secondary | ICD-10-CM

## 2023-03-17 ENCOUNTER — Other Ambulatory Visit: Payer: Self-pay

## 2023-03-17 DIAGNOSIS — K219 Gastro-esophageal reflux disease without esophagitis: Secondary | ICD-10-CM

## 2023-03-17 MED ORDER — OMEPRAZOLE 20 MG PO CPDR: 20.0000 mg | DELAYED_RELEASE_CAPSULE | Freq: Every day | ORAL | 3 refills | Status: DC

## 2023-12-20 ENCOUNTER — Other Ambulatory Visit: Payer: Self-pay | Admitting: Family Medicine

## 2023-12-20 DIAGNOSIS — Z1231 Encounter for screening mammogram for malignant neoplasm of breast: Secondary | ICD-10-CM

## 2024-01-06 ENCOUNTER — Ambulatory Visit: Payer: Managed Care, Other (non HMO) | Admitting: Family Medicine

## 2024-01-06 ENCOUNTER — Encounter: Payer: Self-pay | Admitting: Family Medicine

## 2024-01-06 ENCOUNTER — Ambulatory Visit: Payer: Self-pay | Admitting: Family Medicine

## 2024-01-06 VITALS — BP 136/80 | HR 79 | Temp 98.3°F | Resp 18 | Ht 67.0 in | Wt 202.6 lb

## 2024-01-06 DIAGNOSIS — Z0000 Encounter for general adult medical examination without abnormal findings: Secondary | ICD-10-CM

## 2024-01-06 DIAGNOSIS — T7840XA Allergy, unspecified, initial encounter: Secondary | ICD-10-CM

## 2024-01-06 DIAGNOSIS — E785 Hyperlipidemia, unspecified: Secondary | ICD-10-CM

## 2024-01-06 DIAGNOSIS — E559 Vitamin D deficiency, unspecified: Secondary | ICD-10-CM

## 2024-01-06 LAB — CBC WITH DIFFERENTIAL/PLATELET
Basophils Absolute: 0 K/uL (ref 0.0–0.1)
Basophils Relative: 0.3 % (ref 0.0–3.0)
Eosinophils Absolute: 0.1 K/uL (ref 0.0–0.7)
Eosinophils Relative: 1.6 % (ref 0.0–5.0)
HCT: 39 % (ref 36.0–46.0)
Hemoglobin: 13 g/dL (ref 12.0–15.0)
Lymphocytes Relative: 43.3 % (ref 12.0–46.0)
Lymphs Abs: 2.1 K/uL (ref 0.7–4.0)
MCHC: 33.3 g/dL (ref 30.0–36.0)
MCV: 89.8 fl (ref 78.0–100.0)
Monocytes Absolute: 0.5 K/uL (ref 0.1–1.0)
Monocytes Relative: 9.4 % (ref 3.0–12.0)
Neutro Abs: 2.2 K/uL (ref 1.4–7.7)
Neutrophils Relative %: 45.4 % (ref 43.0–77.0)
Platelets: 257 K/uL (ref 150.0–400.0)
RBC: 4.34 Mil/uL (ref 3.87–5.11)
RDW: 14.3 % (ref 11.5–15.5)
WBC: 4.9 K/uL (ref 4.0–10.5)

## 2024-01-06 LAB — COMPREHENSIVE METABOLIC PANEL WITH GFR
ALT: 17 U/L (ref 0–35)
AST: 18 U/L (ref 0–37)
Albumin: 4.7 g/dL (ref 3.5–5.2)
Alkaline Phosphatase: 86 U/L (ref 39–117)
BUN: 14 mg/dL (ref 6–23)
CO2: 28 meq/L (ref 19–32)
Calcium: 10.7 mg/dL — ABNORMAL HIGH (ref 8.4–10.5)
Chloride: 105 meq/L (ref 96–112)
Creatinine, Ser: 1.12 mg/dL (ref 0.40–1.20)
GFR: 51.45 mL/min — ABNORMAL LOW (ref >60.00)
Glucose, Bld: 88 mg/dL (ref 70–99)
Potassium: 4.4 meq/L (ref 3.5–5.1)
Sodium: 142 meq/L (ref 135–145)
Total Bilirubin: 0.6 mg/dL (ref 0.2–1.2)
Total Protein: 7.4 g/dL (ref 6.0–8.3)

## 2024-01-06 LAB — LIPID PANEL
Cholesterol: 274 mg/dL — ABNORMAL HIGH (ref 0–200)
HDL: 48.6 mg/dL (ref >39.00)
LDL Cholesterol: 200 mg/dL — ABNORMAL HIGH (ref 0–99)
NonHDL: 225.32
Total CHOL/HDL Ratio: 6
Triglycerides: 129 mg/dL (ref 0.0–149.0)
VLDL: 25.8 mg/dL (ref 0.0–40.0)

## 2024-01-06 LAB — VITAMIN D 25 HYDROXY (VIT D DEFICIENCY, FRACTURES): VITD: >120 ng/mL

## 2024-01-06 LAB — TSH: TSH: 1.61 u[IU]/mL (ref 0.35–5.50)

## 2024-01-06 MED ORDER — AZELASTINE HCL 0.1 % NA SOLN: 1.0000 | Freq: Two times a day (BID) | NASAL | 12 refills | Status: AC

## 2024-01-06 NOTE — Assessment & Plan Note (Signed)
 Ghm utd Check labs  See AVS Health Maintenance  Topic Date Due   Medicare Annual Wellness (AWV)  Never done   Pneumococcal Vaccine: 50+ Years (1 of 1 - PCV) Never done   Mammogram  01/18/2024   Hepatitis C Screening  01/09/2028 (Originally 01/30/1976)   HIV Screening  01/09/2028 (Originally 01/29/1973)   COVID-19 Vaccine (6 - 2025-26 season) 06/21/2024   Colonoscopy  12/07/2025   DTaP/Tdap/Td (3 - Td or Tdap) 01/09/2027   Influenza Vaccine  Completed   Bone Density Scan  Completed   Zoster Vaccines- Shingrix  Completed   Hepatitis B Vaccines 19-59 Average Risk  Aged Out   Meningococcal B Vaccine  Aged Out

## 2024-01-06 NOTE — Assessment & Plan Note (Signed)
 Encourage heart healthy diet such as MIND or DASH diet, increase exercise, avoid trans fats, simple carbohydrates and processed foods, consider a krill or fish or flaxseed oil cap daily.

## 2024-01-06 NOTE — Progress Notes (Signed)
 Subjective:    Patient ID: Gwendolyn Weaver, female    DOB: [birth year], 63 y.o.   MRN: [id number]  Chief Complaint  Patient presents with   Annual Exam    Pt states fasting     HPI Patient is in today for cpe.  Discussed the use of AI scribe software for clinical note transcription with the patient, who gave verbal consent to proceed.  History of Present Illness Gwendolyn Weaver is a 63 year old female who presents for an annual physical exam.  She experiences allergy symptoms, which she attributes to not wearing a hat in windy weather. She has not used a nasal spray before but is considering trying one due to a rough night with symptoms. She currently takes Lucevix for her allergies. No current use of Zyrtec  or Claritin.  She mentions her cholesterol levels have always been high and inquires about medication options. There is a family history of heart issues, with her father having had a heart attack.  She experiences occasional right knee pain, which she attributes to arthritis, noting it worsens with cold or rainy weather.  She is retired and spends a significant amount of time caring for her mother, who is nearly 49 years old and has mobility issues. Her mother lives in [place], and she visits daily from [place]. She has not been using her stationary bike as much as she would like, citing difficulty integrating it into her routine while caring for her mother.  She regularly visits the eye doctor and dentist, although she has not yet visited the new dentist who took over her previous dentist's practice.    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    LGSIL ON BIOPSY   Sickle cell trait     Past Surgical History:  Procedure Laterality Date   ABDOMINAL HYSTERECTOMY     fibroids   CESAREAN SECTION     X2   BTL   CYSTOSCOPY N/A 02/13/2014   Procedure: CYSTOSCOPY;  Surgeon: Evalene SHAUNNA Organ, MD;  Location: [facility];  Service: Gynecology;  Laterality: N/A;   DILATATION & CURETTAGE/HYSTEROSCOPY WITH TRUECLEAR N/A 10/19/2013   Procedure: DILATATION & CURETTAGE/HYSTEROSCOPY ;  Surgeon: Evalene SHAUNNA Organ, MD;  Location: [facility];  Service: Gynecology;  Laterality: N/A;   DILATION AND CURETTAGE OF UTERUS     HEMORRHOID SURGERY     HEMORRHOID SURGERY  80'S   LAPAROSCOPIC ASSISTED VAGINAL HYSTERECTOMY N/A 02/13/2014   Procedure: LAPAROSCOPIC ASSISTED VAGINAL HYSTERECTOMY;  Surgeon: Evalene SHAUNNA Organ, MD;  Location: [facility];  Service: Gynecology;  Laterality: N/A;   LAPAROSCOPIC BILATERAL SALPINGO OOPHERECTOMY Bilateral 02/13/2014   Procedure: LAPAROSCOPIC BILATERAL SALPINGO OOPHORECTOMY;  Surgeon: Evalene SHAUNNA Organ, MD;  Location: [facility];  Service: Gynecology;  Laterality: Bilateral;   PARATHYROID  EXPLORATION  12/2009   SMALL TUMOR REMOVED. BENIGN.    Family History  Problem Relation Age of Onset   Hypertension Father    Other Father        circulation issues - bilateral amputations   Prostate cancer Father 38   Asthma Mother    Colon cancer Neg Hx    Esophageal cancer Neg Hx    Rectal cancer Neg Hx    Stomach cancer Neg Hx     Social History   Socioeconomic History   Marital  status: Married    Spouse name: Not on file   Number of children: Not on file   Years of education: Not on file   Highest education level: Not on file  Occupational History   Occupation: [employer]    Comment: retired 01/2023  Tobacco Use   Smoking status: Never   Smokeless tobacco: Never  Substance and Sexual Activity   Alcohol use: No   Drug use: No   Sexual activity: Yes    Birth control/protection: None  Other Topics Concern   Not on file  Social History Narrative   Exercise- no much now    Social Drivers of Health   Tobacco Use: Low Risk (01/06/2024)   Patient History    Smoking Tobacco Use: Never     Smokeless Tobacco Use: Never    Passive Exposure: Not on file  Financial Resource Strain: Not on file  Food Insecurity: Not on file  Transportation Needs: Not on file  Physical Activity: Not on file  Stress: Not on file  Social Connections: Not on file  Intimate Partner Violence: Not on file  Depression (PHQ2-9): Low Risk (01/06/2024)   Depression (PHQ2-9)    PHQ-2 Score: 0  Alcohol Screen: Not on file  Housing: Not on file  Utilities: Not on file  Health Literacy: Not on file    Outpatient Medications Prior to Visit  Medication Sig Dispense Refill   acetaminophen  (TYLENOL ) 500 MG tablet Take 1,000 mg by mouth every 6 (six) hours as needed (headache).     BIOTIN PO Take 1 capsule by mouth daily.     FIBER PO Take 1 tablet by mouth daily.     Flaxseed, Linseed, (FLAX SEED OIL PO) Take 3 tablets by mouth daily.      Multiple Vitamin (MULTIVITAMIN) tablet Take 1 tablet by mouth daily.      omeprazole  (PRILOSEC) 20 MG capsule Take 1 capsule (20 mg total) by mouth daily. 90 capsule 3   OVER THE COUNTER MEDICATION Iron 65 mg. One two tablets daily.     VITAMIN D  PO Take 3 capsules by mouth daily.     No facility-administered medications prior to visit.    Allergies[1]  Review of Systems  Constitutional:  Negative for chills, fever and malaise/fatigue.  HENT:  Negative for congestion and hearing loss.   Eyes:  Negative for blurred vision and discharge.  Respiratory:  Negative for cough, sputum production and shortness of breath.   Cardiovascular:  Negative for chest pain, palpitations and leg swelling.  Gastrointestinal:  Negative for abdominal pain, blood in stool, constipation, diarrhea, heartburn, nausea and vomiting.  Genitourinary:  Negative for dysuria, frequency, hematuria and urgency.  Musculoskeletal:  Negative for back pain, falls and myalgias.  Skin:  Negative for rash.  Neurological:  Negative for dizziness, sensory change, loss of consciousness, weakness and  headaches.  Endo/Heme/Allergies:  Negative for environmental allergies. Does not bruise/bleed easily.  Psychiatric/Behavioral:  Negative for depression and suicidal ideas. The patient is not nervous/anxious and does not have insomnia.        Objective:    Physical Exam Vitals and nursing note reviewed.  Constitutional:      General: She is not in acute distress.    Appearance: Normal appearance. She is well-developed.  HENT:     Head: Normocephalic and atraumatic.  Eyes:     General: No scleral icterus.       Right eye: No discharge.        Left eye: No  discharge.  Cardiovascular:     Rate and Rhythm: Normal rate and regular rhythm.     Heart sounds: No murmur heard. Pulmonary:     Effort: Pulmonary effort is normal. No respiratory distress.     Breath sounds: Normal breath sounds.  Musculoskeletal:        General: Normal range of motion.     Cervical back: Normal range of motion and neck supple.     Right lower leg: No edema.     Left lower leg: No edema.  Skin:    General: Skin is warm and dry.  Neurological:     Mental Status: She is alert and oriented to person, place, and time.  Psychiatric:        Mood and Affect: Mood normal.        Behavior: Behavior normal.        Thought Content: Thought content normal.        Judgment: Judgment normal.     BP 136/80 (BP Location: Left Arm, Patient Position: Sitting, Cuff Size: Large)   Pulse 79   Temp 98.3 F (36.8 C) (Oral)   Resp 18   Ht 5' 7 (1.702 m)   Wt 202 lb 9.6 oz (91.9 kg)   LMP 09/28/2013   SpO2 96%   BMI 31.73 kg/m  Wt Readings from Last 3 Encounters:  01/06/24 202 lb 9.6 oz (91.9 kg)  01/04/23 201 lb 12.8 oz (91.5 kg)  12/30/21 206 lb (93.4 kg)    Diabetic Foot Exam - Simple   No data filed    Lab Results  Component Value Date   WBC 4.1 01/04/2023   HGB 13.2 01/04/2023   HCT 39.4 01/04/2023   PLT 237.0 01/04/2023   GLUCOSE 81 01/04/2023   CHOL 265 (H) 01/04/2023   TRIG 99.0 01/04/2023    HDL 46.00 01/04/2023   LDLDIRECT 167.0 12/30/2021   LDLCALC 199 (H) 01/04/2023   ALT 18 01/04/2023   AST 20 01/04/2023   NA 140 01/04/2023   K 4.2 01/04/2023   CL 106 01/04/2023   CREATININE 1.01 01/04/2023   BUN 16 01/04/2023   CO2 24 01/04/2023   TSH 1.16 01/04/2023   INR 1.02 12/24/2009    Lab Results  Component Value Date   TSH 1.16 01/04/2023   Lab Results  Component Value Date   WBC 4.1 01/04/2023   HGB 13.2 01/04/2023   HCT 39.4 01/04/2023   MCV 90.7 01/04/2023   PLT 237.0 01/04/2023   Lab Results  Component Value Date   NA 140 01/04/2023   K 4.2 01/04/2023   CO2 24 01/04/2023   GLUCOSE 81 01/04/2023   BUN 16 01/04/2023   CREATININE 1.01 01/04/2023   BILITOT 0.6 01/04/2023   ALKPHOS 87 01/04/2023   AST 20 01/04/2023   ALT 18 01/04/2023   PROT 7.0 01/04/2023   ALBUMIN 4.5 01/04/2023   CALCIUM 10.1 01/04/2023   ANIONGAP 5 02/06/2014   GFR 58.66 (L) 01/04/2023   Lab Results  Component Value Date   CHOL 265 (H) 01/04/2023   Lab Results  Component Value Date   HDL 46.00 01/04/2023   Lab Results  Component Value Date   LDLCALC 199 (H) 01/04/2023   Lab Results  Component Value Date   TRIG 99.0 01/04/2023   Lab Results  Component Value Date   CHOLHDL 6 01/04/2023   No results found for: HGBA1C     Assessment & Plan:  Preventative health care Assessment & Plan:  Ghm utd Check labs  See AVS Health Maintenance  Topic Date Due   Medicare Annual Wellness (AWV)  Never done   Pneumococcal Vaccine: 50+ Years (1 of 1 - PCV) Never done   Mammogram  01/18/2024   Hepatitis C Screening  01/09/2028 (Originally 01/30/1978)   HIV Screening  01/09/2028 (Originally 01/29/1975)   COVID-19 Vaccine (6 - 2025-26 season) 06/21/2024   Colonoscopy  12/07/2025   DTaP/Tdap/Td (3 - Td or Tdap) 01/09/2027   Influenza Vaccine  Completed   Bone Density Scan  Completed   Zoster Vaccines- Shingrix  Completed   Hepatitis B Vaccines 19-59 Average Risk  Aged Out    Meningococcal B Vaccine  Aged Out     Orders: -     CBC with Differential/Platelet -     Comprehensive metabolic panel with GFR -     Lipid panel -     TSH  Hyperlipidemia with target low density lipoprotein (LDL) cholesterol less than 100 mg/dL Assessment & Plan: Encourage heart healthy diet such as MIND or DASH diet, increase exercise, avoid trans fats, simple carbohydrates and processed foods, consider a krill or fish or flaxseed oil cap daily.    Orders: -     Comprehensive metabolic panel with GFR -     Lipid panel -     Lipoprotein A (LPA) -     CT CARDIAC SCORING (SELF PAY ONLY); Future  Vitamin D  deficiency -     VITAMIN D  25 Hydroxy (Vit-D Deficiency, Fractures)  Allergy, initial encounter -     Azelastine HCl; Place 1 spray into both nostrils 2 (two) times daily. Use in each nostril as directed  Dispense: 30 mL; Refill: 12   Assessment and Plan Assessment & Plan Hyperlipidemia   Chronic hyperlipidemia with a family history of heart disease. Discussed potential need for cholesterol medication based on genetic testing results. Ordered LP(a) genetic test to assess genetic risk for hyperlipidemia. Ordered calcium score CT scan to assess cardiovascular risk, to be performed in [place].  Allergic rhinitis   Recent exacerbation likely due to weather changes. Currently using Lucevix for management. Discussed potential benefit of nasal sprays such as Astelin or Astepro. Recommended trial of Astelin or Astepro nasal spray.  General Health Maintenance   Routine health maintenance discussed. Regular eye and dental check-ups are maintained. Emphasized the importance of incorporating exercise into her daily routine despite caregiving responsibilities. Encouraged regular exercise, such as using a stationary bike at home.   Catrina Fellenz R Lowne Chase, DO     [1]  Allergies Allergen Reactions   Amoxicillin-Pot Clavulanate Diarrhea

## 2024-01-12 LAB — LIPOPROTEIN A (LPA): Lipoprotein (a): 172 nmol/L — ABNORMAL HIGH (ref <75)

## 2024-01-19 ENCOUNTER — Other Ambulatory Visit: Payer: Self-pay

## 2024-01-19 ENCOUNTER — Ambulatory Visit (HOSPITAL_BASED_OUTPATIENT_CLINIC_OR_DEPARTMENT_OTHER)
Admission: RE | Admit: 2024-01-19 | Discharge: 2024-01-19 | Disposition: A | Discharge status: Home / Self Care | Payer: Self-pay | Source: Ambulatory Visit | Attending: Family Medicine | Admitting: Family Medicine

## 2024-01-19 DIAGNOSIS — E785 Hyperlipidemia, unspecified: Secondary | ICD-10-CM

## 2024-01-19 MED ORDER — ROSUVASTATIN CALCIUM 10 MG PO TABS: 10.0000 mg | ORAL_TABLET | Freq: Every day | ORAL | 2 refills | Status: AC

## 2024-01-25 ENCOUNTER — Ambulatory Visit
Admission: RE | Admit: 2024-01-25 | Discharge: 2024-01-25 | Disposition: A | Discharge status: Home / Self Care | Payer: Self-pay | Source: Ambulatory Visit | Attending: Family Medicine | Admitting: Family Medicine

## 2024-01-25 DIAGNOSIS — Z1231 Encounter for screening mammogram for malignant neoplasm of breast: Secondary | ICD-10-CM

## 2024-01-28 ENCOUNTER — Other Ambulatory Visit: Payer: Self-pay | Admitting: Family Medicine

## 2024-01-28 DIAGNOSIS — R928 Other abnormal and inconclusive findings on diagnostic imaging of breast: Secondary | ICD-10-CM

## 2024-02-01 ENCOUNTER — Other Ambulatory Visit: Payer: Self-pay | Admitting: Family Medicine

## 2024-02-01 DIAGNOSIS — K219 Gastro-esophageal reflux disease without esophagitis: Secondary | ICD-10-CM

## 2024-02-10 ENCOUNTER — Ambulatory Visit: Admission: RE | Admit: 2024-02-10 | Source: Ambulatory Visit

## 2024-02-10 ENCOUNTER — Ambulatory Visit
Admission: RE | Admit: 2024-02-10 | Discharge: 2024-02-10 | Disposition: A | Source: Ambulatory Visit | Attending: Family Medicine | Admitting: Family Medicine

## 2024-02-10 DIAGNOSIS — R928 Other abnormal and inconclusive findings on diagnostic imaging of breast: Secondary | ICD-10-CM

## 2025-01-08 ENCOUNTER — Encounter: Admitting: Family Medicine
